# Patient Record
Sex: Male | Born: 1950
Health system: Southern US, Community
[De-identification: ages and names within clinical notes are randomized; demographics above are authoritative.]

## PROBLEM LIST (undated history)

## (undated) DIAGNOSIS — R112 Nausea with vomiting, unspecified: Secondary | ICD-10-CM

## (undated) DIAGNOSIS — K219 Gastro-esophageal reflux disease without esophagitis: Secondary | ICD-10-CM

## (undated) DIAGNOSIS — Z9889 Other specified postprocedural states: Secondary | ICD-10-CM

## (undated) DIAGNOSIS — K8020 Calculus of gallbladder without cholecystitis without obstruction: Secondary | ICD-10-CM

## (undated) DIAGNOSIS — C801 Malignant (primary) neoplasm, unspecified: Secondary | ICD-10-CM

## (undated) DIAGNOSIS — E790 Hyperuricemia without signs of inflammatory arthritis and tophaceous disease: Secondary | ICD-10-CM

## (undated) DIAGNOSIS — M1990 Unspecified osteoarthritis, unspecified site: Secondary | ICD-10-CM

## (undated) DIAGNOSIS — I499 Cardiac arrhythmia, unspecified: Secondary | ICD-10-CM

## (undated) DIAGNOSIS — N400 Enlarged prostate without lower urinary tract symptoms: Secondary | ICD-10-CM

## (undated) HISTORY — DX: Calculus of gallbladder without cholecystitis without obstruction: K80.20

## (undated) HISTORY — DX: Unspecified osteoarthritis, unspecified site: M19.90

## (undated) HISTORY — PX: EYE SURGERY: SHX253

---

## 2000-09-14 ENCOUNTER — Ambulatory Visit (HOSPITAL_COMMUNITY): Admission: RE | Admit: 2000-09-14 | Discharge: 2000-09-14 | Payer: Self-pay | Admitting: Family Medicine

## 2000-09-14 ENCOUNTER — Encounter: Payer: Self-pay | Admitting: Family Medicine

## 2002-02-24 ENCOUNTER — Ambulatory Visit (HOSPITAL_COMMUNITY): Admission: RE | Admit: 2002-02-24 | Discharge: 2002-02-24 | Payer: Self-pay | Admitting: Family Medicine

## 2002-02-24 ENCOUNTER — Encounter: Payer: Self-pay | Admitting: Family Medicine

## 2002-11-21 ENCOUNTER — Other Ambulatory Visit: Admission: RE | Admit: 2002-11-21 | Discharge: 2002-11-21 | Payer: Self-pay | Admitting: Urology

## 2003-10-29 ENCOUNTER — Emergency Department (HOSPITAL_COMMUNITY): Admission: EM | Admit: 2003-10-29 | Discharge: 2003-10-29 | Payer: Self-pay | Admitting: Emergency Medicine

## 2003-11-30 ENCOUNTER — Ambulatory Visit (HOSPITAL_COMMUNITY): Admission: RE | Admit: 2003-11-30 | Discharge: 2003-11-30 | Payer: Self-pay | Admitting: Family Medicine

## 2004-07-29 ENCOUNTER — Ambulatory Visit (HOSPITAL_COMMUNITY): Admission: RE | Admit: 2004-07-29 | Discharge: 2004-07-29 | Payer: Self-pay | Admitting: Family Medicine

## 2004-09-17 ENCOUNTER — Ambulatory Visit (HOSPITAL_COMMUNITY): Admission: RE | Admit: 2004-09-17 | Discharge: 2004-09-17 | Payer: Self-pay | Admitting: Orthopedic Surgery

## 2005-01-31 ENCOUNTER — Ambulatory Visit: Payer: Self-pay | Admitting: *Deleted

## 2005-02-07 ENCOUNTER — Ambulatory Visit (HOSPITAL_COMMUNITY): Admission: RE | Admit: 2005-02-07 | Discharge: 2005-02-07 | Payer: Self-pay | Admitting: *Deleted

## 2005-02-07 ENCOUNTER — Ambulatory Visit: Payer: Self-pay | Admitting: Cardiovascular Disease

## 2005-02-14 ENCOUNTER — Ambulatory Visit: Payer: Self-pay | Admitting: *Deleted

## 2005-12-12 ENCOUNTER — Ambulatory Visit: Payer: Self-pay | Admitting: *Deleted

## 2005-12-16 ENCOUNTER — Ambulatory Visit: Payer: Self-pay | Admitting: *Deleted

## 2005-12-16 ENCOUNTER — Encounter (HOSPITAL_COMMUNITY): Admission: RE | Admit: 2005-12-16 | Discharge: 2006-01-15 | Payer: Self-pay | Admitting: *Deleted

## 2006-01-09 ENCOUNTER — Ambulatory Visit: Payer: Self-pay | Admitting: Cardiology

## 2006-01-23 ENCOUNTER — Ambulatory Visit (HOSPITAL_COMMUNITY): Admission: RE | Admit: 2006-01-23 | Discharge: 2006-01-23 | Payer: Self-pay | Admitting: Cardiology

## 2006-03-17 ENCOUNTER — Ambulatory Visit (HOSPITAL_COMMUNITY): Admission: RE | Admit: 2006-03-17 | Discharge: 2006-03-17 | Payer: Self-pay | Admitting: Family Medicine

## 2006-04-03 ENCOUNTER — Ambulatory Visit (HOSPITAL_COMMUNITY): Admission: RE | Admit: 2006-04-03 | Discharge: 2006-04-03 | Payer: Self-pay | Admitting: Orthopedic Surgery

## 2006-05-21 ENCOUNTER — Emergency Department (HOSPITAL_COMMUNITY): Admission: EM | Admit: 2006-05-21 | Discharge: 2006-05-21 | Payer: Self-pay | Admitting: Emergency Medicine

## 2008-11-30 ENCOUNTER — Ambulatory Visit (HOSPITAL_COMMUNITY): Admission: RE | Admit: 2008-11-30 | Discharge: 2008-11-30 | Payer: Self-pay | Admitting: Family Medicine

## 2009-10-02 ENCOUNTER — Ambulatory Visit (HOSPITAL_COMMUNITY): Admission: RE | Admit: 2009-10-02 | Discharge: 2009-10-02 | Payer: Self-pay | Admitting: Family Medicine

## 2009-12-24 ENCOUNTER — Ambulatory Visit (HOSPITAL_COMMUNITY): Admission: RE | Admit: 2009-12-24 | Discharge: 2009-12-24 | Payer: Self-pay | Admitting: Family Medicine

## 2009-12-28 ENCOUNTER — Ambulatory Visit (HOSPITAL_COMMUNITY): Admission: RE | Admit: 2009-12-28 | Discharge: 2009-12-28 | Payer: Self-pay | Admitting: Family Medicine

## 2010-09-13 ENCOUNTER — Other Ambulatory Visit (HOSPITAL_COMMUNITY): Payer: Self-pay | Admitting: Family Medicine

## 2010-09-13 DIAGNOSIS — N281 Cyst of kidney, acquired: Secondary | ICD-10-CM

## 2010-09-13 NOTE — Procedures (Signed)
NAMEWOODSON, MACHA             ACCOUNT NO.:  [id number]   MEDICAL RECORD NO.:  [id number]          PATIENT TYPE:  OUT   LOCATION:  RAD                           FACILITY:  [facility]   PHYSICIAN:  Charlton Haws, M.D.     DATE OF BIRTH:  05/11/1950   DATE OF PROCEDURE:  DATE OF DISCHARGE:                                  ECHOCARDIOGRAM   INDICATIONS:  Lower extremity edema.  Check LV function.  Left ventricular  cavity size was normal.  Ejection fraction was 60%.  There was mild LVH.  There was no regional wall motion abnormality.  Mitral valve is mildly  thickened with trivial MR.  Left atrium and right sided carotid chambers are  normal.  There is no evidence of pulmonary hypertension.  There was mild TR.  Aortic valve was trileaflet and minimally sclerotic.  Pulmonary valve  appeared normal.  Subcostal imaging revealed no atrial septal defect.  No  source of embolus.  No effusion.   IMPRESSION:  1.  Mild left ventricular hypertrophy, ejection fraction 60%.  2.  Normal right sided cardiac chambers.  No evidence of pulmonary      hypertension.  3.  Mild tricuspid regurgitation.  4.  Trivial mitral regurgitation.  5.  Mild aortic valve sclerosis.  6.  No peripheral effusion.           ______________________________  Charlton Haws, M.D.     PN/MEDQ  D:  02/07/2005  T:  02/07/2005  Job:  [id number]   cc:   Angus G. Renard Matter, MD  Fax: [phone]

## 2010-09-13 NOTE — Assessment & Plan Note (Signed)
[facility]                         [place] [facility]IOLOGY OFFICE NOTE   [place], ANTOLIN                    MRN:          [id number]  DATE:12/12/2005                            DOB:          03/01/1951    PRIMARY CARE PHYSICIAN:  Angus G. Renard Matter, M.D.   Mr. Miller returns today.  I saw this gentleman back in October 2006, for  some lower extremity edema.  We did an echocardiogram which looked good,  lower extremity Doppler studies which looked good, and we never did really  get a sense for what his lower extremity edema was from.  It has pretty much  resolved now.  But, now he is having some discomfort in his chest which is  relatively atypical.  It is a sharp pain, lasting about 2-3 seconds, not  associated with exertion.  It usually happens when he is asleep.  No  shortness of breath.  No diaphoresis.  No PND or orthopnea.  And he says  that is kind of comes and goes.  He has not had any in the last couple of  days.   PAST MEDICAL HISTORY:  1. Hypertension.  2. Hyperlipidemia.  3. Obesity.   He is on the following medications:  Multivitamin, vitamin B1, B12, vitamin  C, zinc, Prilosec, and aspirin 325 once a day.   PHYSICAL EXAMINATION:  VITAL SIGNS:  Today, he is 244 pounds.  His blood  pressure is 118/76.  His pulse is 68.  CHEST:  Clear.  NECK:  He has no jugular venous distention or carotid bruits.  CARDIOVASCULAR:  Regular with no murmur.  LOWER EXTREMITIES:  Have trace edema.   He had an electrocardiogram done, in Dr. Marcelo Baldy' office, which is  unremarkable.  It did not look too different from the EKG that we got on him  in October, which was essentially normal.   So, we talked with Mr. and Mrs. Savo about what to do.  They have chosen  an exercise perfusion study which I think is a reasonable thing.  We will  get him set up for that.  We will see him back after the results of that are  completed.                  Farris Has. Dorethea Clan, MD   JMH/MedQ  DD:  12/12/2005  DT:  12/12/2005  Job #:  [id number]

## 2010-09-13 NOTE — Letter (Signed)
January 09, 2006     Angus G. Renard Matter, MD  [address]  [place], [place] [address]   RE:  YOSHIAKI, KREUSER  MRN:  [id number]  /  DOB:  [birth year]   Dear Thalia Party:   Mr. Peddie returns to the office following a recent stress nuclear study  that was entirely negative.  He continues to have some vague atypical chest  discomfort that he attributes to a musculoskeletal etiology. This does not  appear to be concerning him significantly.   He has had edema in the past, but this has resolved spontaneously.  He is  not currently using diuretics.   He has carried a diagnosis of hypertension in the past and been treated with  HCTZ.  He does not believe that he has any significant elevation in blood  pressure and is currently taking no medication other than a number of  vitamins.  Blood pressures have always been normal in the office.   PHYSICAL EXAMINATION:  VITAL SIGNS:  A pleasant gentleman in no acute  distress.  NECK:  No jugular venous distention.  No carotid bruits.  LUNGS:  Clear.  CARDIAC:  Fourth heart sounds present.  Normal first and second heart  sounds.  EXTREMITIES:  No edema.   IMPRESSION:  Mr. Beckom is doing extremely well.  He has no known  cardiovascular issues.  Please let me know at any time that I can assist in  his future care.  He is encouraged to exercise, lose weight, and to maintain  a healthful diet.    Sincerely,      Gerrit Friends. Dietrich Pates, MD, [facility]   RMR/MedQ  DD:  01/09/2006  DT:  01/10/2006  Job #:  [id number]

## 2010-09-17 ENCOUNTER — Ambulatory Visit (HOSPITAL_COMMUNITY)
Admission: RE | Admit: 2010-09-17 | Discharge: 2010-09-17 | Disposition: A | Discharge status: Home / Self Care | Payer: BC Managed Care – PPO | Source: Ambulatory Visit | Attending: Family Medicine | Admitting: Family Medicine

## 2010-09-17 DIAGNOSIS — Q619 Cystic kidney disease, unspecified: Secondary | ICD-10-CM | POA: Insufficient documentation

## 2010-09-17 DIAGNOSIS — N281 Cyst of kidney, acquired: Secondary | ICD-10-CM

## 2010-09-17 DIAGNOSIS — I714 Abdominal aortic aneurysm, without rupture, unspecified: Secondary | ICD-10-CM | POA: Insufficient documentation

## 2011-01-30 ENCOUNTER — Encounter: Payer: Self-pay | Admitting: *Deleted

## 2011-01-30 ENCOUNTER — Other Ambulatory Visit: Payer: Self-pay

## 2011-01-30 ENCOUNTER — Emergency Department (HOSPITAL_COMMUNITY)
Admission: EM | Admit: 2011-01-30 | Discharge: 2011-01-31 | Disposition: A | Discharge status: Home / Self Care | Payer: BC Managed Care – PPO | Attending: Emergency Medicine | Admitting: Emergency Medicine

## 2011-01-30 ENCOUNTER — Emergency Department (HOSPITAL_COMMUNITY): Payer: BC Managed Care – PPO

## 2011-01-30 DIAGNOSIS — R1110 Vomiting, unspecified: Secondary | ICD-10-CM | POA: Insufficient documentation

## 2011-01-30 DIAGNOSIS — Z79899 Other long term (current) drug therapy: Secondary | ICD-10-CM | POA: Insufficient documentation

## 2011-01-30 DIAGNOSIS — F17200 Nicotine dependence, unspecified, uncomplicated: Secondary | ICD-10-CM | POA: Insufficient documentation

## 2011-01-30 DIAGNOSIS — R002 Palpitations: Secondary | ICD-10-CM | POA: Insufficient documentation

## 2011-01-30 LAB — BASIC METABOLIC PANEL
CO2: 28 mEq/L (ref 19–32)
Calcium: 9.1 mg/dL (ref 8.4–10.5)
Chloride: 104 mEq/L (ref 96–112)
Potassium: 3.4 mEq/L — ABNORMAL LOW (ref 3.5–5.1)
Sodium: 140 mEq/L (ref 135–145)

## 2011-01-30 NOTE — ED Notes (Signed)
Pt reports intermittent episodes of "palpitations" starting this am, pt reports it feels like his heart is "stopping", pt denies any pain

## 2011-01-31 LAB — URINALYSIS, ROUTINE W REFLEX MICROSCOPIC
Bilirubin Urine: NEGATIVE
Hgb urine dipstick: NEGATIVE
Nitrite: NEGATIVE
Protein, ur: NEGATIVE mg/dL
Urobilinogen, UA: 0.2 mg/dL (ref 0.0–1.0)

## 2011-01-31 LAB — DIFFERENTIAL
Basophils Absolute: 0 10*3/uL (ref 0.0–0.1)
Basophils Relative: 0 % (ref 0–1)
Eosinophils Absolute: 0.2 10*3/uL (ref 0.0–0.7)
Eosinophils Relative: 2 % (ref 0–5)
Lymphocytes Relative: 32 % (ref 12–46)
Monocytes Relative: 7 % (ref 3–12)
Neutro Abs: 4.5 10*3/uL (ref 1.7–7.7)
Neutrophils Relative %: 58 % (ref 43–77)

## 2011-01-31 LAB — TSH: TSH: 3.982 u[IU]/mL (ref 0.350–4.500)

## 2011-01-31 LAB — CBC
Hemoglobin: 14.1 g/dL (ref 13.0–17.0)
MCH: 29.8 pg (ref 26.0–34.0)
WBC: 7.8 10*3/uL (ref 4.0–10.5)

## 2011-01-31 LAB — TROPONIN I: Troponin I: <0.3 ng/mL (ref <0.30)

## 2011-01-31 LAB — T4, FREE: Free T4: 1.14 ng/dL (ref 0.80–1.80)

## 2011-01-31 MED ORDER — POTASSIUM CHLORIDE CRYS ER 20 MEQ PO TBCR
40.0000 meq | EXTENDED_RELEASE_TABLET | Freq: Once | ORAL | Status: AC
  Administered 2011-01-31: 40 meq via ORAL
  Filled 2011-01-31: qty 2

## 2011-01-31 NOTE — ED Provider Notes (Signed)
History     CSN: [id number] Arrival date & time: 01/30/2011 10:34 PM  Chief Complaint  Patient presents with  . Palpitations    (Consider location/radiation/quality/duration/timing/severity/associated sxs/prior treatment) HPI Comments: Seen 55  Patient is a 60 y.o. male presenting with palpitations. The history is provided by the patient.  Palpitations  This is a new problem. The current episode started 12 to 24 hours ago (Patient began to notice occasionally irregular heart rate this morning. Has continued all day.). Episode frequency: intermittantly all day. The problem has not changed since onset.Associated with: nothing. Associated symptoms include vomiting. Pertinent negatives include no diaphoresis, no fever, no numbness, no chest pain, no chest pressure, no claudication, no exertional chest pressure, no near-syncope, no orthopnea, no abdominal pain, no nausea, no back pain, no leg pain, no lower extremity edema, no dizziness, no cough, no hemoptysis and no shortness of breath. Associated symptoms comments: Patient states he is aware of irregularity however does not have any symptoms associated with it.. He has tried nothing for the symptoms.    Past Medical History  Diagnosis Date  . Coronary artery disease     Past Surgical History  Procedure Date  . Eye surgery     No family history on file.  History  Substance Use Topics  . Smoking status: Current Some Day Smoker  . Smokeless tobacco: Current User    Types: Chew  . Alcohol Use: No      Review of Systems  Constitutional: Negative for fever and diaphoresis.  Respiratory: Negative for cough, hemoptysis and shortness of breath.   Cardiovascular: Positive for palpitations. Negative for chest pain, orthopnea, claudication and near-syncope.  Gastrointestinal: Positive for vomiting. Negative for nausea and abdominal pain.  Musculoskeletal: Negative for back pain.  Neurological: Negative for dizziness and numbness.    All other systems reviewed and are negative.    Allergies  Review of patient's allergies indicates no known allergies.  Home Medications   Current Outpatient Rx  Name Route Sig Dispense Refill  . CETIRIZINE HCL 10 MG PO TABS Oral Take 10 mg by mouth daily.      Carma Leaven M PLUS PO TABS Oral Take 1 tablet by mouth daily.      Marland Kitchen UNKNOWN TO PATIENT Oral Take 1 tablet by mouth daily. MEDICATION NAME UNKNOWN *For High Uric Acid*     . ACETAMINOPHEN 500 MG PO TABS Oral Take 500 mg by mouth as needed. For pain     . IBUPROFEN 200 MG PO TABS Oral Take 200 mg by mouth as needed. For pain       BP 148/89  Pulse 50  Temp(Src) 98.4 F (36.9 C) (Oral)  Resp 18  SpO2 95%  Physical Exam  Nursing note and vitals reviewed. Constitutional: He is oriented to person, place, and time. He appears well-developed and well-nourished.  HENT:  Head: Normocephalic and atraumatic.  Eyes: EOM are normal.  Neck: Normal range of motion. Neck supple. No JVD present.  Cardiovascular: Normal rate, normal heart sounds and intact distal pulses.        Occasional irregular rhythm  Pulmonary/Chest: Effort normal and breath sounds normal.  Musculoskeletal: Normal range of motion.  Neurological: He is alert and oriented to person, place, and time. He has normal reflexes.  Skin: Skin is warm and dry.    ED Course  Procedures (including critical care time)  Results for orders placed during the hospital encounter of 01/30/11  CBC      Component Value  Range   WBC 7.8  4.0 - 10.5 (K/uL)   RBC 4.73  4.22 - 5.81 (MIL/uL)   Hemoglobin 14.1  13.0 - 17.0 (g/dL)   HCT 16.1  09.6 - 04.5 (%)   MCV 89.2  78.0 - 100.0 (fL)   MCH 29.8  26.0 - 34.0 (pg)   MCHC 33.4  30.0 - 36.0 (g/dL)   RDW 40.9  81.1 - 91.4 (%)   Platelets 219  150 - 400 (K/uL)  DIFFERENTIAL      Component Value Range   Neutrophils Relative 58  43 - 77 (%)   Neutro Abs 4.5  1.7 - 7.7 (K/uL)   Lymphocytes Relative 32  12 - 46 (%)   Lymphs Abs 2.5   0.7 - 4.0 (K/uL)   Monocytes Relative 7  3 - 12 (%)   Monocytes Absolute 0.6  0.1 - 1.0 (K/uL)   Eosinophils Relative 2  0 - 5 (%)   Eosinophils Absolute 0.2  0.0 - 0.7 (K/uL)   Basophils Relative 0  0 - 1 (%)   Basophils Absolute 0.0  0.0 - 0.1 (K/uL)  BASIC METABOLIC PANEL      Component Value Range   Sodium 140  135 - 145 (mEq/L)   Potassium 3.4 (*) 3.5 - 5.1 (mEq/L)   Chloride 104  96 - 112 (mEq/L)   CO2 28  19 - 32 (mEq/L)   Glucose, Bld 108 (*) 70 - 99 (mg/dL)   BUN 11  6 - 23 (mg/dL)   Creatinine, Ser 7.82  0.50 - 1.35 (mg/dL)   Calcium 9.1  8.4 - 95.6 (mg/dL)   GFR calc non Af Amer 88 (*) >90 (mL/min)   GFR calc Af Amer >90  >90 (mL/min)  URINALYSIS, ROUTINE W REFLEX MICROSCOPIC      Component Value Range   Color, Urine STRAW (*) YELLOW    Appearance CLEAR  CLEAR    Specific Gravity, Urine 1.010  1.005 - 1.030    pH 6.5  5.0 - 8.0    Glucose, UA NEGATIVE  NEGATIVE (mg/dL)   Hgb urine dipstick NEGATIVE  NEGATIVE    Bilirubin Urine NEGATIVE  NEGATIVE    Ketones, ur NEGATIVE  NEGATIVE (mg/dL)   Protein, ur NEGATIVE  NEGATIVE (mg/dL)   Urobilinogen, UA 0.2  0.0 - 1.0 (mg/dL)   Nitrite NEGATIVE  NEGATIVE    Leukocytes, UA NEGATIVE  NEGATIVE   TROPONIN I      Component Value Range   Troponin I <0.30  <0.30 (ng/mL)  POCT I-STAT TROPONIN I      Component Value Range   Troponin i, poc 0.00  0.00 - 0.08 (ng/mL)   Comment 3           Dg Chest 2 View  01/31/2011  *RADIOLOGY REPORT*  Clinical Data: Palpitations  CHEST - 2 VIEW  Comparison: 10/02/2009; 11/30/2003; chest CT - 01/23/2006  Findings: Normal cardiac silhouette and mediastinal contours. There is persistent minimal heterogeneous opacities witin the left lower lung, likely atelectasis or scar.  No new focal parenchymal opacities.  Mild elevation of the right hemidiaphragm.  No pleural effusion or pneumothorax.  Unchanged bones.  IMPRESSION: No acute cardiopulmonary disease.  Original Report Authenticated By: Waynard Reeds, M.D.     Date: 01/31/2011  2227  Rate:62  Rhythm: normal sinus rhythm and premature atrial contractions (PAC)  QRS Axis: normal  Intervals: normal  ST/T Wave abnormalities: normal  Conduction Disutrbances:none  Narrative Interpretation:  Old EKG Reviewed: changes noted since EKG of 05/21/2006, PACs now present  Patient with palpitation all day with NO associated symptoms. Labs unremarkable. Potassium slightly low. Will replete. Patient ambulated in hallway with no additional symptoms and no significant increase in occasional PAC.Patient informed of clinical course, understand medical decision-making process, and agree with plan.Pt stable in ED with no significant deterioration in condition.Patient to follow up with PCP, Dr. Renard Matter. Referral to cardiology should he have further symptoms will be facilitated by PCP.   MDM Reviewed: nursing note and vitals Reviewed previous: labs, ECG and x-ray Interpretation: labs, ECG and x-ray         Aurther Loft S. Colon Branch, MD 01/31/11 [phone]

## 2011-01-31 NOTE — ED Notes (Signed)
Pt up to ambulate in hallway, placed back on cardiac monitor without showing any ectopy or pvcs.   Pt tolerated well, denies complaints

## 2012-01-09 ENCOUNTER — Ambulatory Visit (HOSPITAL_COMMUNITY)
Admission: RE | Admit: 2012-01-09 | Discharge: 2012-01-09 | Disposition: A | Discharge status: Home / Self Care | Payer: BC Managed Care – PPO | Source: Ambulatory Visit | Attending: Family Medicine | Admitting: Family Medicine

## 2012-01-09 ENCOUNTER — Other Ambulatory Visit (HOSPITAL_COMMUNITY): Payer: Self-pay | Admitting: Family Medicine

## 2012-01-09 DIAGNOSIS — Z0000 Encounter for general adult medical examination without abnormal findings: Secondary | ICD-10-CM | POA: Insufficient documentation

## 2012-01-09 DIAGNOSIS — Z87891 Personal history of nicotine dependence: Secondary | ICD-10-CM | POA: Insufficient documentation

## 2012-01-15 ENCOUNTER — Encounter (INDEPENDENT_AMBULATORY_CARE_PROVIDER_SITE_OTHER): Payer: Self-pay | Admitting: *Deleted

## 2012-02-09 ENCOUNTER — Other Ambulatory Visit (HOSPITAL_COMMUNITY): Payer: Self-pay | Admitting: Family Medicine

## 2012-02-09 DIAGNOSIS — R109 Unspecified abdominal pain: Secondary | ICD-10-CM

## 2012-02-10 ENCOUNTER — Ambulatory Visit (HOSPITAL_COMMUNITY)
Admission: RE | Admit: 2012-02-10 | Discharge: 2012-02-10 | Disposition: A | Discharge status: Home / Self Care | Payer: BC Managed Care – PPO | Source: Ambulatory Visit | Attending: Family Medicine | Admitting: Family Medicine

## 2012-02-10 DIAGNOSIS — K8020 Calculus of gallbladder without cholecystitis without obstruction: Secondary | ICD-10-CM | POA: Insufficient documentation

## 2012-02-10 DIAGNOSIS — R109 Unspecified abdominal pain: Secondary | ICD-10-CM | POA: Insufficient documentation

## 2012-11-19 ENCOUNTER — Encounter (INDEPENDENT_AMBULATORY_CARE_PROVIDER_SITE_OTHER): Payer: Self-pay | Admitting: *Deleted

## 2012-12-03 ENCOUNTER — Encounter (INDEPENDENT_AMBULATORY_CARE_PROVIDER_SITE_OTHER): Payer: Self-pay | Admitting: *Deleted

## 2012-12-21 ENCOUNTER — Encounter (HOSPITAL_COMMUNITY): Payer: Self-pay | Admitting: Pharmacy Technician

## 2012-12-21 ENCOUNTER — Ambulatory Visit (INDEPENDENT_AMBULATORY_CARE_PROVIDER_SITE_OTHER): Payer: BC Managed Care – PPO | Admitting: Internal Medicine

## 2012-12-21 ENCOUNTER — Telehealth (INDEPENDENT_AMBULATORY_CARE_PROVIDER_SITE_OTHER): Payer: Self-pay | Admitting: *Deleted

## 2012-12-21 ENCOUNTER — Other Ambulatory Visit (INDEPENDENT_AMBULATORY_CARE_PROVIDER_SITE_OTHER): Payer: Self-pay | Admitting: *Deleted

## 2012-12-21 ENCOUNTER — Encounter (INDEPENDENT_AMBULATORY_CARE_PROVIDER_SITE_OTHER): Payer: Self-pay | Admitting: Internal Medicine

## 2012-12-21 VITALS — BP 104/58 | HR 60 | Temp 98.9°F | Ht 72.0 in | Wt 248.4 lb

## 2012-12-21 DIAGNOSIS — Z1211 Encounter for screening for malignant neoplasm of colon: Secondary | ICD-10-CM

## 2012-12-21 DIAGNOSIS — R197 Diarrhea, unspecified: Secondary | ICD-10-CM

## 2012-12-21 DIAGNOSIS — M1990 Unspecified osteoarthritis, unspecified site: Secondary | ICD-10-CM | POA: Insufficient documentation

## 2012-12-21 DIAGNOSIS — M129 Arthropathy, unspecified: Secondary | ICD-10-CM

## 2012-12-21 DIAGNOSIS — K8020 Calculus of gallbladder without cholecystitis without obstruction: Secondary | ICD-10-CM

## 2012-12-21 NOTE — Patient Instructions (Addendum)
Fiber 4 gms po. Imodium twice a day. Colonoscopy with Dr. Karilyn Cota

## 2012-12-21 NOTE — Progress Notes (Signed)
Subjective:     Patient ID: Caleb Reed, male   DOB: 1950/05/21, 62 y.o.   MRN: [id number]  HPI Referred to our office by Dr. Renard Matter for diarrhea./screening colonoscopy.  He has had diarrhea off and on for 6-8 months. He tells me the Imodium has helped with his diarrhea. He does have normal, formed stools. He tells me he will have a loose stool about every other day.  No recent antibiotics. He did take an antibiotic for a dental procedure the first of the year. Symptoms started after taking the antibiotics. He usually has anywhere from 3-4 stools a day.  There has been no melena or bright red rectal bleeding. Sometimes he has some lower abdominal pain with his BMs. Appetite is good. He thinks he has had some weight loss from work. He has never undergone a colonoscopy in the past. After he eats a meal, within 30 monutes to an hour he will have to have a BM.  Stools studies in July by Dr. Renard Matter were negative.   11/05/2012 C. Diff negative. Stool culture was negative for salmonella, shigella, campylobacter, Yersinia, or E. Coli.   Review of Systems see hpi Current Outpatient Prescriptions  Medication Sig Dispense Refill  . acetaminophen (TYLENOL) 500 MG tablet Take 500 mg by mouth as needed. For pain       . allopurinol (ZYLOPRIM) 100 MG tablet Take 100 mg by mouth daily.      . fexofenadine (ALLEGRA) 180 MG tablet Take 180 mg by mouth daily.      Marland Kitchen ibuprofen (ADVIL,MOTRIN) 200 MG tablet Take 200 mg by mouth as needed. For pain       . loperamide (IMODIUM) 2 MG capsule Take 2 mg by mouth 4 (four) times daily as needed for diarrhea or loose stools.      . silodosin (RAPAFLO) 4 MG CAPS capsule Take 8 mg by mouth daily with breakfast.       No current facility-administered medications for this visit.   Past Surgical History  Procedure Laterality Date  . Eye surgery      Shit with an arrow. Can see some out of his eye   Allergies  Allergen Reactions  . Etodolac     Hives all  over   Past Medical History  Diagnosis Date  . Gallstones   . Arthritis         Objective:   Physical Exam  Filed Vitals:   12/21/12 1119  BP: 104/58  Pulse: 60  Temp: 98.9 F (37.2 C)  Height: 6' (1.829 m)  Weight: 248 lb 6.4 oz (112.674 kg)   Alert and oriented. Skin warm and dry. Oral mucosa is moist.   . Sclera anicteric, conjunctivae is pink. Thyroid not enlarged. No cervical lymphadenopathy. Lungs clear. Heart regular rate and rhythm.  Abdomen is soft. Bowel sounds are positive. No hepatomegaly. No abdominal masses felt. No tenderness.  No edema to lower extremities.  Small mass noted just below epigastric region.      Assessment:    Diarrhea which is better now. He will have every other day.  Her stools are more now.  He has never undergone a colonoscopy in the past..    Plan:    Fiber 4 gms. Imodium 2mg s BID. Colonoscopy with Dr. Karilyn Cota. The risks and benefits such as perforation, bleeding, and infection were reviewed with the patient and is agreeable.

## 2012-12-21 NOTE — Telephone Encounter (Signed)
Patient needs movi prep 

## 2012-12-23 MED ORDER — PEG-KCL-NACL-NASULF-NA ASC-C 100 G PO SOLR: 1.0000 | Freq: Once | ORAL | Status: DC

## 2012-12-29 ENCOUNTER — Encounter (HOSPITAL_COMMUNITY): Payer: Self-pay | Admitting: *Deleted

## 2012-12-29 ENCOUNTER — Encounter (HOSPITAL_COMMUNITY)
Admission: RE | Disposition: A | Discharge status: Home / Self Care | Payer: Self-pay | Source: Ambulatory Visit | Attending: Internal Medicine

## 2012-12-29 ENCOUNTER — Ambulatory Visit (HOSPITAL_COMMUNITY)
Admission: RE | Admit: 2012-12-29 | Discharge: 2012-12-29 | Disposition: A | Discharge status: Home / Self Care | Payer: BC Managed Care – PPO | Source: Ambulatory Visit | Attending: Internal Medicine | Admitting: Internal Medicine

## 2012-12-29 DIAGNOSIS — R109 Unspecified abdominal pain: Secondary | ICD-10-CM

## 2012-12-29 DIAGNOSIS — K5730 Diverticulosis of large intestine without perforation or abscess without bleeding: Secondary | ICD-10-CM | POA: Insufficient documentation

## 2012-12-29 DIAGNOSIS — D126 Benign neoplasm of colon, unspecified: Secondary | ICD-10-CM

## 2012-12-29 DIAGNOSIS — R197 Diarrhea, unspecified: Secondary | ICD-10-CM

## 2012-12-29 DIAGNOSIS — Z1211 Encounter for screening for malignant neoplasm of colon: Secondary | ICD-10-CM | POA: Insufficient documentation

## 2012-12-29 HISTORY — PX: COLONOSCOPY: SHX5424

## 2012-12-29 HISTORY — DX: Nausea with vomiting, unspecified: R11.2

## 2012-12-29 HISTORY — DX: Hyperuricemia without signs of inflammatory arthritis and tophaceous disease: E79.0

## 2012-12-29 HISTORY — DX: Other specified postprocedural states: Z98.890

## 2012-12-29 HISTORY — DX: Benign prostatic hyperplasia without lower urinary tract symptoms: N40.0

## 2012-12-29 SURGERY — COLONOSCOPY
Anesthesia: Moderate Sedation

## 2012-12-29 MED ORDER — STERILE WATER FOR IRRIGATION IR SOLN
Status: DC | PRN
  Administered 2012-12-29: 15:00:00

## 2012-12-29 MED ORDER — MIDAZOLAM HCL 5 MG/5ML IJ SOLN
INTRAMUSCULAR | Status: AC
  Filled 2012-12-29: qty 10

## 2012-12-29 MED ORDER — SODIUM CHLORIDE 0.9 % IV SOLN: INTRAVENOUS | Status: DC

## 2012-12-29 MED ORDER — MIDAZOLAM HCL 5 MG/5ML IJ SOLN
INTRAMUSCULAR | Status: DC | PRN
  Administered 2012-12-29 (×5): 2 mg via INTRAVENOUS

## 2012-12-29 MED ORDER — MEPERIDINE HCL 50 MG/ML IJ SOLN
INTRAMUSCULAR | Status: AC
  Filled 2012-12-29: qty 1

## 2012-12-29 MED ORDER — MEPERIDINE HCL 50 MG/ML IJ SOLN
INTRAMUSCULAR | Status: DC | PRN
  Administered 2012-12-29 (×2): 25 mg via INTRAVENOUS

## 2012-12-29 MED ORDER — LOPERAMIDE HCL 2 MG PO TABS: 2.0000 mg | ORAL_TABLET | Freq: Every day | ORAL | Status: DC

## 2012-12-29 NOTE — H&P (Signed)
Caleb Reed is an 62 y.o. male.   Chief Complaint: Patient is here for colonoscopy. HPI: Patient is a 62 year old Caucasian male who is here for colonoscopy primarily for screening purposes. However he does give history of diarrhea for the last 7-8 months. Diarrhea started after he was in antibiotic dental problems. And most days he has in 1-4 stools per day. She also complains of intermittent nagging pain across her upper abdomen which may occur a couple of times a week. He denies melena or rectal bleeding or weight loss. Family history is negative for colorectal carcinoma.  Past Medical History  Diagnosis Date  . Gallstones   . Arthritis   . Elevated uric acid in blood   . BPH (benign prostatic hyperplasia)   . PONV (postoperative nausea and vomiting)     Past Surgical History  Procedure Laterality Date  . Eye surgery      Shit with an arrow. Can see some out of his eye    Family History  Problem Relation Age of Onset  . Colon cancer Neg Hx    Social History:  reports that he has quit smoking. His smokeless tobacco use includes Chew. He reports that  drinks alcohol. He reports that he does not use illicit drugs.  Allergies:  Allergies  Allergen Reactions  . Etodolac     Hives all over    Medications Prior to Admission  Medication Sig Dispense Refill  . acetaminophen (TYLENOL) 500 MG tablet Take 500 mg by mouth as needed. For pain       . allopurinol (ZYLOPRIM) 100 MG tablet Take 100 mg by mouth daily.      . fexofenadine (ALLEGRA) 180 MG tablet Take 180 mg by mouth daily.      Marland Kitchen ibuprofen (ADVIL,MOTRIN) 200 MG tablet Take 200 mg by mouth as needed. For pain       . loperamide (IMODIUM) 2 MG capsule Take 2 mg by mouth 4 (four) times daily as needed for diarrhea or loose stools.      . peg 3350 powder (MOVIPREP) 100 G SOLR Take 1 kit (200 g total) by mouth once.  1 kit  0  . silodosin (RAPAFLO) 4 MG CAPS capsule Take 8 mg by mouth daily with breakfast.        No  results found for this or any previous visit (from the past 48 hour(s)). No results found.  ROS  Blood pressure 136/77, pulse 53, temperature 98.3 F (36.8 C), temperature source Oral, resp. rate 15, SpO2 94.00%. Physical Exam  Constitutional: He appears well-developed and well-nourished.  HENT:  Mouth/Throat: Oropharynx is clear and moist.  Eyes: Conjunctivae are normal. No scleral icterus.  Neck: No thyromegaly present.  Cardiovascular: Normal rate, regular rhythm and normal heart sounds.   No murmur heard. Respiratory: Effort normal and breath sounds normal.  GI: Soft. He exhibits no distension and no mass. There is no tenderness.  Musculoskeletal: He exhibits no edema.  Lymphadenopathy:    He has no cervical adenopathy.  Neurological: He is alert.  Skin: Skin is warm and dry.     Assessment/Plan Average risk screening colonoscopy. 7-8 month history of diarrhea.  REHMAN,NAJEEB U 12/29/2012, 2:53 PM

## 2012-12-29 NOTE — Op Note (Addendum)
COLONOSCOPY PROCEDURE REPORT  PATIENT:  Caleb Reed  MR#:  [id number] Birthdate:  1950/09/08, 62 y.o., male Endoscopist:  Dr. Malissa Hippo, MD Referred By:  Dr. Ishmael Holter. Renard Matter, MD  Procedure Date: 12/29/2012  Procedure:   Colonoscopy  Indications:  Patient is 62 year old Caucasian male who is here for screening colonoscopy. He does give seven months history of diarrhea. Stool studies were  negative. Diarrhea started when he took antibiotic for dental infection and has continued. Family history is negative for CRC.  Informed Consent:  The procedure and risks were reviewed with the patient and informed consent was obtained.  Medications:  Demerol 50 mg IV Versed 10 mg IV  Description of procedure:  After a digital rectal exam was performed, that colonoscope was advanced from the anus through the rectum and colon to the area of the cecum, ileocecal valve and appendiceal orifice. The cecum was deeply intubated. These structures were well-seen and photographed for the record. From the level of the cecum and ileocecal valve, the scope was slowly and cautiously withdrawn. The mucosal surfaces were carefully surveyed utilizing scope tip to flexion to facilitate fold flattening as needed. The scope was pulled down into the rectum where a thorough exam including retroflexion was performed.  Findings:   Prep satisfactory. Three small polyps ablated via cold biopsy from transverse colon and submitted together. These were located in close proximity. Few small diverticula at sigmoid colon. Random biopsies taken from normal-appearing mucosa of sigmoid colon. Normal rectal mucosa. Thickened anoderm.   Therapeutic/Diagnostic Maneuvers Performed:  See above  Complications:  None  Cecal Withdrawal Time:  10 minutes  Impression:  Examination performed to cecum. Three small polyps ablated while cold biopsy from transverse colon and submitted together. Mild sigmoid colon diverticulosis. No  evidence of endoscopic colitis. Random biopsies taken from mucosa of sigmoid colon looking for microscopic or collagenous colitis.  Recommendations:  Standard instructions given. Imodium OTC 2 mg by mouth every morning. High fiber diet. I will contact patient with biopsy results and further recommendations.  Analynn Daum U  12/29/2012 3:52 PM  CC: Dr. Alice Reichert, MD & Dr. Bonnetta Barry ref. provider found

## 2013-01-04 ENCOUNTER — Encounter (HOSPITAL_COMMUNITY): Payer: Self-pay | Admitting: Internal Medicine

## 2013-01-10 ENCOUNTER — Encounter (INDEPENDENT_AMBULATORY_CARE_PROVIDER_SITE_OTHER): Payer: Self-pay | Admitting: *Deleted

## 2013-02-21 ENCOUNTER — Telehealth (INDEPENDENT_AMBULATORY_CARE_PROVIDER_SITE_OTHER): Payer: Self-pay | Admitting: *Deleted

## 2013-02-21 NOTE — Telephone Encounter (Signed)
Caleb Reed and Caleb Reed called to scheduled an 8 week f/u from Caleb [facility]. Caleb Reed is still having abd trouble and feels like he needs to be seen. Offered him an apt for 02/23/13 and patient refused. He said he only wanted to see Dr. Karilyn Cota. Offered Caleb next available for 04/04/13 and was told he would be sick by then that he would just go to see Dr. Renard Matter.

## 2013-02-24 ENCOUNTER — Inpatient Hospital Stay (HOSPITAL_COMMUNITY): Payer: BC Managed Care – PPO

## 2013-02-24 ENCOUNTER — Emergency Department (HOSPITAL_COMMUNITY): Payer: BC Managed Care – PPO

## 2013-02-24 ENCOUNTER — Encounter (HOSPITAL_COMMUNITY): Payer: Self-pay | Admitting: Emergency Medicine

## 2013-02-24 ENCOUNTER — Inpatient Hospital Stay (HOSPITAL_COMMUNITY)
Admission: EM | Admit: 2013-02-24 | Discharge: 2013-02-26 | DRG: 419 | Disposition: A | Discharge status: Home / Self Care | Payer: BC Managed Care – PPO | Attending: General Surgery | Admitting: General Surgery

## 2013-02-24 DIAGNOSIS — K810 Acute cholecystitis: Secondary | ICD-10-CM

## 2013-02-24 DIAGNOSIS — N400 Enlarged prostate without lower urinary tract symptoms: Secondary | ICD-10-CM | POA: Diagnosis present

## 2013-02-24 DIAGNOSIS — Z87891 Personal history of nicotine dependence: Secondary | ICD-10-CM

## 2013-02-24 DIAGNOSIS — M129 Arthropathy, unspecified: Secondary | ICD-10-CM | POA: Diagnosis present

## 2013-02-24 DIAGNOSIS — K8000 Calculus of gallbladder with acute cholecystitis without obstruction: Principal | ICD-10-CM | POA: Diagnosis present

## 2013-02-24 HISTORY — DX: Malignant (primary) neoplasm, unspecified: C80.1

## 2013-02-24 HISTORY — DX: Gastro-esophageal reflux disease without esophagitis: K21.9

## 2013-02-24 LAB — COMPREHENSIVE METABOLIC PANEL WITH GFR
ALT: 15 U/L (ref 0–53)
AST: 14 U/L (ref 0–37)
Albumin: 3.7 g/dL (ref 3.5–5.2)
Alkaline Phosphatase: 103 U/L (ref 39–117)
BUN: 10 mg/dL (ref 6–23)
CO2: 28 meq/L (ref 19–32)
Calcium: 9.4 mg/dL (ref 8.4–10.5)
Chloride: 96 meq/L (ref 96–112)
Creatinine, Ser: 0.78 mg/dL (ref 0.50–1.35)
GFR calc Af Amer: >90 mL/min (ref >90)
GFR calc non Af Amer: >90 mL/min (ref >90)
Glucose, Bld: 151 mg/dL — ABNORMAL HIGH (ref 70–99)
Potassium: 4 meq/L (ref 3.5–5.1)
Sodium: 134 meq/L — ABNORMAL LOW (ref 135–145)
Total Bilirubin: 0.7 mg/dL (ref 0.3–1.2)
Total Protein: 7.2 g/dL (ref 6.0–8.3)

## 2013-02-24 LAB — CBC WITH DIFFERENTIAL/PLATELET
Hemoglobin: 14.2 g/dL (ref 13.0–17.0)
Lymphocytes Relative: 11 % — ABNORMAL LOW (ref 12–46)
Lymphs Abs: 1.1 10*3/uL (ref 0.7–4.0)
Monocytes Relative: 9 % (ref 3–12)
Neutro Abs: 8.2 10*3/uL — ABNORMAL HIGH (ref 1.7–7.7)
Neutrophils Relative %: 80 % — ABNORMAL HIGH (ref 43–77)
RBC: 4.77 MIL/uL (ref 4.22–5.81)
WBC: 10.3 10*3/uL (ref 4.0–10.5)

## 2013-02-24 LAB — LIPASE, BLOOD: Lipase: 15 U/L (ref 11–59)

## 2013-02-24 MED ORDER — HYDROMORPHONE HCL PF 1 MG/ML IJ SOLN
1.0000 mg | INTRAMUSCULAR | Status: DC | PRN
  Administered 2013-02-24 – 2013-02-25 (×7): 1 mg via INTRAVENOUS
  Filled 2013-02-24 (×7): qty 1

## 2013-02-24 MED ORDER — ACETAMINOPHEN 650 MG RE SUPP: 650.0000 mg | Freq: Four times a day (QID) | RECTAL | Status: DC | PRN

## 2013-02-24 MED ORDER — MUPIROCIN 2 % EX OINT
1.0000 "application " | TOPICAL_OINTMENT | Freq: Two times a day (BID) | CUTANEOUS | Status: DC
  Administered 2013-02-24 – 2013-02-25 (×3): 1 via NASAL
  Filled 2013-02-24: qty 22

## 2013-02-24 MED ORDER — DIPHENHYDRAMINE HCL 50 MG/ML IJ SOLN: 12.5000 mg | Freq: Four times a day (QID) | INTRAMUSCULAR | Status: DC | PRN

## 2013-02-24 MED ORDER — CHLORHEXIDINE GLUCONATE 4 % EX LIQD
1.0000 "application " | Freq: Once | CUTANEOUS | Status: AC
  Administered 2013-02-24: 1 via TOPICAL
  Filled 2013-02-24 (×2): qty 15

## 2013-02-24 MED ORDER — PANTOPRAZOLE SODIUM 40 MG IV SOLR
40.0000 mg | Freq: Every day | INTRAVENOUS | Status: DC
  Administered 2013-02-24: 40 mg via INTRAVENOUS
  Filled 2013-02-24: qty 40

## 2013-02-24 MED ORDER — SODIUM CHLORIDE 0.9 % IV SOLN
3.0000 g | Freq: Four times a day (QID) | INTRAVENOUS | Status: DC
  Administered 2013-02-24 – 2013-02-25 (×4): 3 g via INTRAVENOUS
  Filled 2013-02-24 (×17): qty 3

## 2013-02-24 MED ORDER — ONDANSETRON HCL 4 MG/2ML IJ SOLN
4.0000 mg | Freq: Four times a day (QID) | INTRAMUSCULAR | Status: DC | PRN
  Administered 2013-02-24 – 2013-02-25 (×4): 4 mg via INTRAVENOUS
  Filled 2013-02-24 (×3): qty 2

## 2013-02-24 MED ORDER — ACETAMINOPHEN 325 MG PO TABS: 650.0000 mg | ORAL_TABLET | Freq: Four times a day (QID) | ORAL | Status: DC | PRN

## 2013-02-24 MED ORDER — CHLORHEXIDINE GLUCONATE CLOTH 2 % EX PADS: 6.0000 | MEDICATED_PAD | Freq: Every day | CUTANEOUS | Status: DC

## 2013-02-24 MED ORDER — DIPHENHYDRAMINE HCL 12.5 MG/5ML PO ELIX: 12.5000 mg | ORAL_SOLUTION | Freq: Four times a day (QID) | ORAL | Status: DC | PRN

## 2013-02-24 MED ORDER — LACTATED RINGERS IV SOLN
INTRAVENOUS | Status: DC
  Administered 2013-02-24 – 2013-02-25 (×4): via INTRAVENOUS

## 2013-02-24 MED ORDER — ONDANSETRON HCL 4 MG/2ML IJ SOLN
4.0000 mg | Freq: Once | INTRAMUSCULAR | Status: AC
  Administered 2013-02-24: 4 mg via INTRAVENOUS
  Filled 2013-02-24: qty 2

## 2013-02-24 MED ORDER — MORPHINE SULFATE 4 MG/ML IJ SOLN
6.0000 mg | Freq: Once | INTRAMUSCULAR | Status: AC
  Administered 2013-02-24: 6 mg via INTRAVENOUS
  Filled 2013-02-24: qty 2

## 2013-02-24 MED ORDER — SODIUM CHLORIDE 0.9 % IV SOLN
3.0000 g | Freq: Four times a day (QID) | INTRAVENOUS | Status: DC
  Filled 2013-02-24 (×4): qty 3

## 2013-02-24 MED ORDER — ENOXAPARIN SODIUM 40 MG/0.4ML ~~LOC~~ SOLN
40.0000 mg | SUBCUTANEOUS | Status: DC
  Administered 2013-02-24: 40 mg via SUBCUTANEOUS
  Filled 2013-02-24 (×2): qty 0.4

## 2013-02-24 MED ORDER — FENTANYL CITRATE 0.05 MG/ML IJ SOLN
50.0000 ug | Freq: Once | INTRAMUSCULAR | Status: AC
  Administered 2013-02-24: 50 ug via INTRAVENOUS
  Filled 2013-02-24: qty 2

## 2013-02-24 NOTE — Progress Notes (Signed)
UR Chart Review Completed  

## 2013-02-24 NOTE — ED Provider Notes (Signed)
CSN: [id number]     Arrival date & time 02/24/13  0612 History   First MD Initiated Contact with Patient 02/24/13 [phone]     Chief Complaint  Patient presents with  . Abdominal Pain    HPI Patient has a known history of cholelithiasis.  He reports constant upper and right upper quadrant abdominal pain over the past 5-6 days.  She's had nausea without vomiting.  He denies fevers and chills.  no chest pain or shortness of breath.  His symptoms are moderate to severe in severity.  No urinary complaints.  No back pain.  He states he is scheduled to see one of the local surgeons in 4 days for evaluation of his cholelithiasis.   Past Medical History  Diagnosis Date  . Gallstones   . Arthritis   . Elevated uric acid in blood   . BPH (benign prostatic hyperplasia)   . PONV (postoperative nausea and vomiting)    Past Surgical History  Procedure Laterality Date  . Eye surgery      Shit with an arrow. Can see some out of his eye  . Colonoscopy N/A 12/29/2012    Procedure: COLONOSCOPY;  Surgeon: Malissa Hippo, MD;  Location: [facility];  Service: Endoscopy;  Laterality: N/A;  300-moved to 200 Ann to notify pt   Family History  Problem Relation Age of Onset  . Colon cancer Neg Hx    History  Substance Use Topics  . Smoking status: Former Games developer  . Smokeless tobacco: Current User    Types: Chew     Comment: quit years ago  . Alcohol Use: Yes     Comment: occasionally wine, not often    Review of Systems  All other systems reviewed and are negative.    Allergies  Etodolac and Hydrocodone  Home Medications   Current Outpatient Rx  Name  Route  Sig  Dispense  Refill  . acetaminophen (TYLENOL) 500 MG tablet   Oral   Take 500 mg by mouth as needed. For pain          . cetirizine (ZYRTEC) 10 MG tablet   Oral   Take 10 mg by mouth daily.         Marland Kitchen ibuprofen (ADVIL,MOTRIN) 200 MG tablet   Oral   Take 200 mg by mouth as needed. For pain          . Lactobacillus  Rhamnosus, GG, (CULTURELLE PO)   Oral   Take by mouth daily.         . lansoprazole (PREVACID) 15 MG capsule   Oral   Take 15 mg by mouth daily.         Marland Kitchen loperamide (IMODIUM A-D) 2 MG tablet   Oral   Take 1 tablet (2 mg total) by mouth daily before breakfast.   30 tablet   0   . silodosin (RAPAFLO) 4 MG CAPS capsule   Oral   Take 8 mg by mouth daily with breakfast.         . allopurinol (ZYLOPRIM) 100 MG tablet   Oral   Take 100 mg by mouth daily.         . fexofenadine (ALLEGRA) 180 MG tablet   Oral   Take 180 mg by mouth daily.          BP 149/73  Pulse 69  Temp(Src) 97.7 F (36.5 C) (Oral)  Resp 22  Ht 6' (1.829 m)  Wt 248 lb (112.492 kg)  BMI 33.63 kg/m2  SpO2 99% Physical Exam  Nursing note and vitals reviewed. Constitutional: He is oriented to person, place, and time. He appears well-developed and well-nourished.  HENT:  Head: Normocephalic and atraumatic.  Eyes: EOM are normal.  Neck: Normal range of motion.  Cardiovascular: Normal rate, regular rhythm, normal heart sounds and intact distal pulses.   Pulmonary/Chest: Effort normal and breath sounds normal. No respiratory distress.  Abdominal: Soft. He exhibits no distension.  RUQ and upper abdominal tenderness without guarding or rebound  Musculoskeletal: Normal range of motion.  Neurological: He is alert and oriented to person, place, and time.  Skin: Skin is warm and dry.  Psychiatric: He has a normal mood and affect. Judgment normal.    ED Course  Procedures (including critical care time) Labs Review Labs Reviewed  CBC WITH DIFFERENTIAL  COMPREHENSIVE METABOLIC PANEL  LIPASE, BLOOD   Imaging Review No results found.  EKG Interpretation   None       MDM  No diagnosis found. Labs and [place] pending at this time. Pain to be treated  Care to Dr Hyacinth Meeker    Lyanne Co, MD 02/24/13 [phone]

## 2013-02-24 NOTE — ED Notes (Signed)
Dr. Lovell Sheehan here for evaluation of pt.

## 2013-02-24 NOTE — H&P (Signed)
Caleb Reed is an 62 y.o. male.   Chief Complaint: Right upper quadrant abdominal pain HPI: Caleb Reed is a 62 year old white male who presents with a five-day history of worsening right upper quadrant abdominal pain. He was seen by his primary care physician earlier this week was scheduled to see another surgeon next week. He presents with worsening right upper quadrant abdominal pain and nausea. Ultrasound the gallbladder reveals cholelithiasis with a thickened gallbladder wall and a positive Murphy's sign. His common bile duct is upper limit of normal. No choledocholithiasis was seen.  Past Medical History  Diagnosis Date  . Gallstones   . Arthritis   . Elevated uric acid in blood   . BPH (benign prostatic hyperplasia)   . PONV (postoperative nausea and vomiting)     Past Surgical History  Procedure Laterality Date  . Eye surgery      Shit with an arrow. Can see some out of his eye  . Colonoscopy N/A 12/29/2012    Procedure: COLONOSCOPY;  Surgeon: Malissa Hippo, MD;  Location: [facility];  Service: Endoscopy;  Laterality: N/A;  300-moved to 200 Ann to notify pt    Family History  Problem Relation Age of Onset  . Colon cancer Neg Hx    Social History:  reports that he has quit smoking. His smokeless tobacco use includes Chew. He reports that he drinks alcohol. He reports that he does not use illicit drugs.  Allergies:  Allergies  Allergen Reactions  . Etodolac     Hives all over  . Hydrocodone Itching     (Not in a hospital admission)  Results for orders placed during the hospital encounter of 02/24/13 (from the past 48 hour(s))  CBC WITH DIFFERENTIAL     Status: Abnormal   Collection Time    02/24/13  6:33 AM      Result Value Range   WBC 10.3  4.0 - 10.5 K/uL   RBC 4.77  4.22 - 5.81 MIL/uL   Hemoglobin 14.2  13.0 - 17.0 g/dL   HCT 16.1  09.6 - 04.5 %   MCV 88.1  78.0 - 100.0 fL   MCH 29.8  26.0 - 34.0 pg   MCHC 33.8  30.0 - 36.0 g/dL   RDW 40.9  81.1 - 91.4  %   Platelets 240  150 - 400 K/uL   Neutrophils Relative % 80 (*) 43 - 77 %   Neutro Abs 8.2 (*) 1.7 - 7.7 K/uL   Lymphocytes Relative 11 (*) 12 - 46 %   Lymphs Abs 1.1  0.7 - 4.0 K/uL   Monocytes Relative 9  3 - 12 %   Monocytes Absolute 0.9  0.1 - 1.0 K/uL   Eosinophils Relative 0  0 - 5 %   Eosinophils Absolute 0.0  0.0 - 0.7 K/uL   Basophils Relative 0  0 - 1 %   Basophils Absolute 0.0  0.0 - 0.1 K/uL  COMPREHENSIVE METABOLIC PANEL     Status: Abnormal   Collection Time    02/24/13  6:33 AM      Result Value Range   Sodium 134 (*) 135 - 145 mEq/L   Potassium 4.0  3.5 - 5.1 mEq/L   Chloride 96  96 - 112 mEq/L   CO2 28  19 - 32 mEq/L   Glucose, Bld 151 (*) 70 - 99 mg/dL   BUN 10  6 - 23 mg/dL   Creatinine, Ser 7.82  0.50 - 1.35 mg/dL  Calcium 9.4  8.4 - 10.5 mg/dL   Total Protein 7.2  6.0 - 8.3 g/dL   Albumin 3.7  3.5 - 5.2 g/dL   AST 14  0 - 37 U/L   ALT 15  0 - 53 U/L   Alkaline Phosphatase 103  39 - 117 U/L   Total Bilirubin 0.7  0.3 - 1.2 mg/dL   GFR calc non Af Amer >90  >90 mL/min   GFR calc Af Amer >90  >90 mL/min   Comment: (NOTE)     The eGFR has been calculated using the CKD EPI equation.     This calculation has not been validated in all clinical situations.     eGFR's persistently <90 mL/min signify possible Chronic Kidney     Disease.  LIPASE, BLOOD     Status: None   Collection Time    02/24/13  6:33 AM      Result Value Range   Lipase 15  11 - 59 U/L   US Abdomen Complete  02/24/2013   CLINICAL DATA:  Right upper quadrant abdominal pain.  EXAM: ULTRASOUND ABDOMEN COMPLETE  COMPARISON:  Ultrasound of February 10, 2012.  FINDINGS: Gallbladder  Stones and sludge are noted within the gallbladder lumen, and gallbladder wall is moderately thickened at 5 mm. No pericholecystic fluid is noted, however positive sonographic Murphy's sign is noted.  Common bile duct  Diameter: Measures 7.4 mm the which is slightly dilated and correlation with liver function tests is  recommended to rule out obstruction.  Liver  No focal lesion identified. Increased echogenicity is noted suggesting fatty infiltration.  IVC  No abnormality visualized.  Pancreas  Visualized portions appear normal.  Spleen  Size and appearance within normal limits.  Right Kidney  Length: Measures 11.2 cm. Echogenicity within normal limits. No mass or hydronephrosis visualized.  Left Kidney  Length: Measures 11.7 cm. Echogenicity within normal limits. No mass or hydronephrosis visualized.  Abdominal aorta  No aneurysm visualized.  IMPRESSION: Fatty infiltration of the liver. Sludge and stones identified within gallbladder lumen with gallbladder wall thickening and positive sonographic Murphy sign; these findings are concerning for acute cholecystitis. Mild dilatation of common bile duct is noted and correlation with liver function tests is recommended to rule out obstruction.   Electronically Signed   By: Roque Lias M.D.   On: 02/24/2013 08:02    Review of Systems  Constitutional: Positive for malaise/fatigue.  HENT: Negative.   Eyes: Negative.   Respiratory: Positive for cough.   Cardiovascular: Negative.   Gastrointestinal: Positive for nausea and abdominal pain.  Genitourinary: Negative.   Musculoskeletal: Negative.   Skin: Negative.   Neurological: Negative.   Endo/Heme/Allergies: Negative.     Blood pressure 149/73, pulse 69, temperature 97.7 F (36.5 C), temperature source Oral, resp. rate 22, height 6' (1.829 m), weight 112.492 kg (248 lb), SpO2 99.00%. Physical Exam  Constitutional: He is oriented to person, place, and time. He appears well-developed and well-nourished.  HENT:  Head: Normocephalic and atraumatic.  Eyes: No scleral icterus.  Neck: Normal range of motion. Neck supple.  Cardiovascular: Normal rate, regular rhythm and normal heart sounds.   Respiratory: Effort normal and breath sounds normal.  GI: Soft. Bowel sounds are normal. There is tenderness.  Tender in the  right upper quadrant to palpation. No rigidity noted.  Neurological: He is alert and oriented to person, place, and time.  Skin: Skin is warm and dry.     Assessment/Plan Impression: Acute cholecystitis, cholelithiasis Plan:  Will admit the Caleb Reed to the hospital for IV hydration and IV Unasyn. He subsequently will undergo a laparoscopic cholecystectomy. The risks and benefits of the procedure including bleeding, infection, hepatobiliary injury, and the possibility of an open procedure were fully explained to the Caleb Reed, who gave informed consent.  Christopherjohn Schiele A 02/24/2013, 8:41 AM

## 2013-02-24 NOTE — ED Notes (Signed)
Spoke with Aundra Millet on phone for report. Will call back when available.

## 2013-02-24 NOTE — Progress Notes (Signed)
CRITICAL VALUE ALERT  Critical value received:  SA positive  Date of notification:  10/30  Time of notification:  1700  Critical value read back:yes  Nurse who received alert:  Sherrye Payor RN  MD notified (1st page):  Lovell Sheehan  Time of first page:  1715  MD notified (2nd page):  Time of second page:  Responding MD:  Lovell Sheehan  Time MD responded:  [phone]

## 2013-02-24 NOTE — ED Notes (Signed)
Pt reports upper abdominal pain that started last week, pt has appointment Monday w/ Dr Malvin Johns for gall bladder evaluation. Pt states pain medication he has is not working. Pt states nausea but no vomiting.

## 2013-02-24 NOTE — ED Notes (Signed)
Pt requesting pain medication prior to transfer to floor.

## 2013-02-24 NOTE — ED Provider Notes (Signed)
Care d/w Dr. Lovell Sheehan who will come to see pt for admission  Vida Roller, MD 02/24/13 [phone]

## 2013-02-25 ENCOUNTER — Encounter (HOSPITAL_COMMUNITY): Payer: BC Managed Care – PPO | Admitting: Anesthesiology

## 2013-02-25 ENCOUNTER — Inpatient Hospital Stay (HOSPITAL_COMMUNITY): Payer: BC Managed Care – PPO

## 2013-02-25 ENCOUNTER — Encounter (HOSPITAL_COMMUNITY): Payer: Self-pay | Admitting: *Deleted

## 2013-02-25 ENCOUNTER — Encounter (HOSPITAL_COMMUNITY)
Admission: EM | Disposition: A | Discharge status: Home / Self Care | Payer: Self-pay | Source: Home / Self Care | Attending: General Surgery

## 2013-02-25 ENCOUNTER — Inpatient Hospital Stay (HOSPITAL_COMMUNITY): Payer: BC Managed Care – PPO | Admitting: Anesthesiology

## 2013-02-25 HISTORY — PX: CHOLECYSTECTOMY: SHX55

## 2013-02-25 LAB — COMPREHENSIVE METABOLIC PANEL
ALT: 41 U/L (ref 0–53)
Alkaline Phosphatase: 112 U/L (ref 39–117)
BUN: 9 mg/dL (ref 6–23)
CO2: 30 mEq/L (ref 19–32)
Chloride: 96 mEq/L (ref 96–112)
Creatinine, Ser: 0.81 mg/dL (ref 0.50–1.35)
GFR calc Af Amer: >90 mL/min (ref >90)
GFR calc non Af Amer: >90 mL/min (ref >90)
Glucose, Bld: 119 mg/dL — ABNORMAL HIGH (ref 70–99)
Potassium: 3.8 mEq/L (ref 3.5–5.1)
Sodium: 134 mEq/L — ABNORMAL LOW (ref 135–145)
Total Bilirubin: 1.1 mg/dL (ref 0.3–1.2)
Total Protein: 6.3 g/dL (ref 6.0–8.3)

## 2013-02-25 LAB — CBC
Hemoglobin: 13.2 g/dL (ref 13.0–17.0)
MCHC: 33.2 g/dL (ref 30.0–36.0)
RDW: 12.8 % (ref 11.5–15.5)
WBC: 11.1 10*3/uL — ABNORMAL HIGH (ref 4.0–10.5)

## 2013-02-25 SURGERY — LAPAROSCOPIC CHOLECYSTECTOMY
Anesthesia: General | Site: Abdomen | Wound class: Contaminated

## 2013-02-25 MED ORDER — BUPIVACAINE HCL (PF) 0.5 % IJ SOLN
INTRAMUSCULAR | Status: DC | PRN
  Administered 2013-02-25: 10 mL

## 2013-02-25 MED ORDER — ONDANSETRON HCL 4 MG/2ML IJ SOLN: 4.0000 mg | Freq: Once | INTRAMUSCULAR | Status: DC | PRN

## 2013-02-25 MED ORDER — GLYCOPYRROLATE 0.2 MG/ML IJ SOLN
0.2000 mg | Freq: Once | INTRAMUSCULAR | Status: AC
  Administered 2013-02-25: 0.2 mg via INTRAVENOUS

## 2013-02-25 MED ORDER — FENTANYL CITRATE 0.05 MG/ML IJ SOLN
25.0000 ug | INTRAMUSCULAR | Status: AC
  Administered 2013-02-25 (×2): 25 ug via INTRAVENOUS

## 2013-02-25 MED ORDER — ROCURONIUM BROMIDE 50 MG/5ML IV SOLN
INTRAVENOUS | Status: AC
  Filled 2013-02-25: qty 1

## 2013-02-25 MED ORDER — ONDANSETRON HCL 4 MG/2ML IJ SOLN: 4.0000 mg | Freq: Four times a day (QID) | INTRAMUSCULAR | Status: DC | PRN

## 2013-02-25 MED ORDER — BUPIVACAINE HCL (PF) 0.5 % IJ SOLN
INTRAMUSCULAR | Status: AC
  Filled 2013-02-25: qty 30

## 2013-02-25 MED ORDER — FENTANYL CITRATE 0.05 MG/ML IJ SOLN
INTRAMUSCULAR | Status: AC
  Filled 2013-02-25: qty 2

## 2013-02-25 MED ORDER — ACETAMINOPHEN 325 MG PO TABS: 650.0000 mg | ORAL_TABLET | Freq: Four times a day (QID) | ORAL | Status: DC | PRN

## 2013-02-25 MED ORDER — FENTANYL CITRATE 0.05 MG/ML IJ SOLN
25.0000 ug | INTRAMUSCULAR | Status: DC | PRN
  Administered 2013-02-25: 50 ug via INTRAVENOUS

## 2013-02-25 MED ORDER — ENOXAPARIN SODIUM 40 MG/0.4ML ~~LOC~~ SOLN
40.0000 mg | SUBCUTANEOUS | Status: DC
  Administered 2013-02-25: 40 mg via SUBCUTANEOUS

## 2013-02-25 MED ORDER — LIDOCAINE HCL (CARDIAC) 10 MG/ML IV SOLN
INTRAVENOUS | Status: DC | PRN
  Administered 2013-02-25: 20 mg via INTRAVENOUS

## 2013-02-25 MED ORDER — 0.9 % SODIUM CHLORIDE (POUR BTL) OPTIME
TOPICAL | Status: DC | PRN
  Administered 2013-02-25: 1000 mL

## 2013-02-25 MED ORDER — GLYCOPYRROLATE 0.2 MG/ML IJ SOLN
INTRAMUSCULAR | Status: AC
  Filled 2013-02-25: qty 1

## 2013-02-25 MED ORDER — SUCCINYLCHOLINE CHLORIDE 20 MG/ML IJ SOLN
INTRAMUSCULAR | Status: DC | PRN
  Administered 2013-02-25: 140 mg via INTRAVENOUS

## 2013-02-25 MED ORDER — LIDOCAINE HCL (PF) 1 % IJ SOLN
INTRAMUSCULAR | Status: AC
  Filled 2013-02-25: qty 5

## 2013-02-25 MED ORDER — DEXAMETHASONE SODIUM PHOSPHATE 4 MG/ML IJ SOLN
4.0000 mg | Freq: Once | INTRAMUSCULAR | Status: AC
  Administered 2013-02-25: 4 mg via INTRAVENOUS

## 2013-02-25 MED ORDER — GLYCOPYRROLATE 0.2 MG/ML IJ SOLN
INTRAMUSCULAR | Status: DC | PRN
  Administered 2013-02-25: 0.4 mg via INTRAVENOUS

## 2013-02-25 MED ORDER — ONDANSETRON HCL 4 MG PO TABS: 4.0000 mg | ORAL_TABLET | Freq: Four times a day (QID) | ORAL | Status: DC | PRN

## 2013-02-25 MED ORDER — GLYCOPYRROLATE 0.2 MG/ML IJ SOLN
INTRAMUSCULAR | Status: AC
  Filled 2013-02-25: qty 2

## 2013-02-25 MED ORDER — OXYCODONE-ACETAMINOPHEN 5-325 MG PO TABS: 1.0000 | ORAL_TABLET | ORAL | Status: DC | PRN

## 2013-02-25 MED ORDER — HEMOSTATIC AGENTS (NO CHARGE) OPTIME
TOPICAL | Status: DC | PRN
  Administered 2013-02-25 (×2): 1 via TOPICAL

## 2013-02-25 MED ORDER — ONDANSETRON HCL 4 MG/2ML IJ SOLN
INTRAMUSCULAR | Status: AC
  Filled 2013-02-25: qty 2

## 2013-02-25 MED ORDER — LACTATED RINGERS IV SOLN
INTRAVENOUS | Status: DC
Start: 2013-02-25 — End: 2013-02-25

## 2013-02-25 MED ORDER — NEOSTIGMINE METHYLSULFATE 1 MG/ML IJ SOLN
INTRAMUSCULAR | Status: DC | PRN
  Administered 2013-02-25: 2 mg via INTRAVENOUS

## 2013-02-25 MED ORDER — MIDAZOLAM HCL 2 MG/2ML IJ SOLN
INTRAMUSCULAR | Status: AC
  Filled 2013-02-25: qty 2

## 2013-02-25 MED ORDER — HYDROMORPHONE HCL PF 1 MG/ML IJ SOLN
1.0000 mg | INTRAMUSCULAR | Status: DC | PRN
  Administered 2013-02-25 – 2013-02-26 (×3): 1 mg via INTRAVENOUS
  Filled 2013-02-25 (×3): qty 1

## 2013-02-25 MED ORDER — FENTANYL CITRATE 0.05 MG/ML IJ SOLN
INTRAMUSCULAR | Status: DC | PRN
  Administered 2013-02-25 (×4): 50 ug via INTRAVENOUS

## 2013-02-25 MED ORDER — SODIUM CHLORIDE 0.9 % IR SOLN
Status: DC | PRN
  Administered 2013-02-25: 3000 mL

## 2013-02-25 MED ORDER — ROCURONIUM BROMIDE 100 MG/10ML IV SOLN
INTRAVENOUS | Status: DC | PRN
  Administered 2013-02-25 (×2): 10 mg via INTRAVENOUS
  Administered 2013-02-25: 25 mg via INTRAVENOUS
  Administered 2013-02-25: 5 mg via INTRAVENOUS
  Administered 2013-02-25: 10 mg via INTRAVENOUS
  Administered 2013-02-25: 5 mg via INTRAVENOUS

## 2013-02-25 MED ORDER — ONDANSETRON HCL 4 MG/2ML IJ SOLN: 4.0000 mg | Freq: Once | INTRAMUSCULAR | Status: DC

## 2013-02-25 MED ORDER — DEXAMETHASONE SODIUM PHOSPHATE 4 MG/ML IJ SOLN
INTRAMUSCULAR | Status: AC
  Filled 2013-02-25: qty 1

## 2013-02-25 MED ORDER — MIDAZOLAM HCL 2 MG/2ML IJ SOLN
1.0000 mg | INTRAMUSCULAR | Status: DC | PRN
  Administered 2013-02-25: 2 mg via INTRAVENOUS

## 2013-02-25 MED ORDER — SODIUM CHLORIDE 0.9 % IV SOLN
3.0000 g | Freq: Four times a day (QID) | INTRAVENOUS | Status: DC
  Administered 2013-02-25 – 2013-02-26 (×4): 3 g via INTRAVENOUS
  Filled 2013-02-25 (×13): qty 3

## 2013-02-25 MED ORDER — SUCCINYLCHOLINE CHLORIDE 20 MG/ML IJ SOLN
INTRAMUSCULAR | Status: AC
  Filled 2013-02-25: qty 1

## 2013-02-25 MED ORDER — PROPOFOL 10 MG/ML IV BOLUS
INTRAVENOUS | Status: DC | PRN
  Administered 2013-02-25: 20 mg via INTRAVENOUS
  Administered 2013-02-25: 30 mg via INTRAVENOUS
  Administered 2013-02-25: 150 mg via INTRAVENOUS

## 2013-02-25 MED ORDER — LACTATED RINGERS IV SOLN
INTRAVENOUS | Status: DC
  Administered 2013-02-25 (×2): via INTRAVENOUS

## 2013-02-25 MED ORDER — PROPOFOL 10 MG/ML IV EMUL
INTRAVENOUS | Status: AC
  Filled 2013-02-25: qty 20

## 2013-02-25 SURGICAL SUPPLY — 46 items
APPLIER CLIP LAPSCP 10X32 DD (CLIP) ×2 IMPLANT
BAG HAMPER (MISCELLANEOUS) ×2 IMPLANT
CLOTH BEACON ORANGE TIMEOUT ST (SAFETY) ×2 IMPLANT
COVER LIGHT HANDLE STERIS (MISCELLANEOUS) ×4 IMPLANT
DECANTER SPIKE VIAL GLASS SM (MISCELLANEOUS) ×2 IMPLANT
DURAPREP 26ML APPLICATOR (WOUND CARE) ×2 IMPLANT
ELECT REM PT RETURN 9FT ADLT (ELECTROSURGICAL) ×2
ELECTRODE REM PT RTRN 9FT ADLT (ELECTROSURGICAL) ×1 IMPLANT
FILTER SMOKE EVAC LAPAROSHD (FILTER) ×2 IMPLANT
FORMALIN 10 PREFIL 120ML (MISCELLANEOUS) IMPLANT
FORMALIN 10 PREFIL 480ML (MISCELLANEOUS) ×2 IMPLANT
GLOVE BIO SURGEON STRL SZ7.5 (GLOVE) ×2 IMPLANT
GLOVE BIOGEL PI IND STRL 7.0 (GLOVE) ×1 IMPLANT
GLOVE BIOGEL PI IND STRL 7.5 (GLOVE) ×1 IMPLANT
GLOVE BIOGEL PI IND STRL 8 (GLOVE) ×1 IMPLANT
GLOVE BIOGEL PI INDICATOR 7.0 (GLOVE) ×1
GLOVE BIOGEL PI INDICATOR 7.5 (GLOVE) ×1
GLOVE BIOGEL PI INDICATOR 8 (GLOVE) ×1
GLOVE ECLIPSE 7.0 STRL STRAW (GLOVE) ×2 IMPLANT
GLOVE ECLIPSE 7.5 STRL STRAW (GLOVE) ×2 IMPLANT
GLOVE EXAM NITRILE MD LF STRL (GLOVE) ×2 IMPLANT
GOWN STRL REIN XL XLG (GOWN DISPOSABLE) ×6 IMPLANT
HEMOSTAT SNOW SURGICEL 2X4 (HEMOSTASIS) ×4 IMPLANT
INST SET LAPROSCOPIC AP (KITS) ×2 IMPLANT
IV NS IRRIG 3000ML ARTHROMATIC (IV SOLUTION) ×2 IMPLANT
KIT ROOM TURNOVER APOR (KITS) ×2 IMPLANT
MANIFOLD NEPTUNE II (INSTRUMENTS) ×2 IMPLANT
NEEDLE INSUFFLATION 14GA 120MM (NEEDLE) ×2 IMPLANT
NS IRRIG 1000ML POUR BTL (IV SOLUTION) ×2 IMPLANT
PACK LAP CHOLE LZT030E (CUSTOM PROCEDURE TRAY) ×2 IMPLANT
PAD ARMBOARD 7.5X6 YLW CONV (MISCELLANEOUS) ×2 IMPLANT
PENCIL HANDSWITCHING (ELECTRODE) ×2 IMPLANT
POUCH SPECIMEN RETRIEVAL 10MM (ENDOMECHANICALS) ×2 IMPLANT
SET BASIN LINEN APH (SET/KITS/TRAYS/PACK) ×2 IMPLANT
SET TUBE IRRIG SUCTION NO TIP (IRRIGATION / IRRIGATOR) ×2 IMPLANT
SLEEVE ENDOPATH XCEL 5M (ENDOMECHANICALS) ×4 IMPLANT
SPONGE GAUZE 2X2 8PLY STRL LF (GAUZE/BANDAGES/DRESSINGS) ×10 IMPLANT
STAPLER VISISTAT (STAPLE) ×2 IMPLANT
SUT VICRYL 0 UR6 27IN ABS (SUTURE) ×6 IMPLANT
TAPE CLOTH SURG 4X10 WHT LF (GAUZE/BANDAGES/DRESSINGS) ×2 IMPLANT
TROCAR ENDO BLADELESS 11MM (ENDOMECHANICALS) ×2 IMPLANT
TROCAR XCEL NON-BLD 5MMX100MML (ENDOMECHANICALS) ×2 IMPLANT
TROCAR XCEL UNIV SLVE 11M 100M (ENDOMECHANICALS) ×2 IMPLANT
TUBING INSUFFLATION (TUBING) ×2 IMPLANT
WARMER LAPAROSCOPE (MISCELLANEOUS) ×2 IMPLANT
YANKAUER SUCT 12FT TUBE ARGYLE (SUCTIONS) ×2 IMPLANT

## 2013-02-25 NOTE — Transfer of Care (Signed)
Immediate Anesthesia Transfer of Care Note  Patient: Caleb Reed  Procedure(s) Performed: Procedure(s) (LRB): LAPAROSCOPIC CHOLECYSTECTOMY (N/A)  Patient Location: PACU  Anesthesia Type: General  Level of Consciousness: awake  Airway & Oxygen Therapy: Patient Spontanous Breathing and non-rebreather face mask  Post-op Assessment: Report given to PACU RN, Post -op Vital signs reviewed and stable and Patient moving all extremities  Post vital signs: Reviewed and stable  Complications: No apparent anesthesia complications

## 2013-02-25 NOTE — Anesthesia Preprocedure Evaluation (Signed)
Anesthesia Evaluation  Patient identified by MRN, date of birth, ID band Patient awake    Reviewed: Allergy & Precautions, H&P , NPO status , Patient's Chart, lab work & pertinent test results  History of Anesthesia Complications (+) PONV and history of anesthetic complications  Airway Mallampati: II TM Distance: >3 FB Neck ROM: Full    Dental  (+) Partial Upper and Teeth Intact   Pulmonary former smoker,  breath sounds clear to auscultation        Cardiovascular negative cardio ROS  Rhythm:Regular Rate:Normal     Neuro/Psych    GI/Hepatic   Endo/Other  Morbid obesity  Renal/GU      Musculoskeletal   Abdominal   Peds  Hematology   Anesthesia Other Findings Acute RUQ pain, nausea.  Reproductive/Obstetrics                           Anesthesia Physical Anesthesia Plan  ASA: II  Anesthesia Plan: General   Post-op Pain Management:    Induction: Intravenous, Rapid sequence and Cricoid pressure planned  Airway Management Planned: Oral ETT  Additional Equipment:   Intra-op Plan:   Post-operative Plan: Extubation in OR  Informed Consent: I have reviewed the patients History and Physical, chart, labs and discussed the procedure including the risks, benefits and alternatives for the proposed anesthesia with the patient or authorized representative who has indicated his/her understanding and acceptance.     Plan Discussed with:   Anesthesia Plan Comments:         Anesthesia Quick Evaluation

## 2013-02-25 NOTE — Progress Notes (Signed)
Pt up and ambulated to bathroom No dizziness no nausea Mild pain at 4 on scale

## 2013-02-25 NOTE — Anesthesia Procedure Notes (Signed)
Procedure Name: Intubation Date/Time: 02/25/2013 9:08 AM Performed by: Franco Nones Pre-anesthesia Checklist: Patient identified, Patient being monitored, Timeout performed, Emergency Drugs available and Suction available Patient Re-evaluated:Patient Re-evaluated prior to inductionOxygen Delivery Method: Circle System Utilized Preoxygenation: Pre-oxygenation with 100% oxygen Intubation Type: IV induction, Rapid sequence and Cricoid Pressure applied Laryngoscope Size: Miller and 2 Grade View: Grade I Tube type: Oral Tube size: 7.0 mm Number of attempts: 1 Airway Equipment and Method: stylet Placement Confirmation: ETT inserted through vocal cords under direct vision,  positive ETCO2 and breath sounds checked- equal and bilateral Secured at: 21 cm Tube secured with: Tape Dental Injury: Teeth and Oropharynx as per pre-operative assessment

## 2013-02-25 NOTE — Op Note (Signed)
Patient:  [name]  DOB:  [birth year]  MRN:  [id number]   Preop Diagnosis:  Acute cholecystitis, cholelithiasis  Postop Diagnosis:  Same  Procedure:  Laparoscopic cholecystectomy  Surgeon:  Franky Macho, M.D.  Anes:   General endotracheal  Indications:  Patient is a 62 year old white male who presented emergency room with worsening right upper quadrant abdominal pain. He is known history of gallstones and repeat ultrasound revealed acute cholecystitis with cholelithiasis. The risks and benefits of the procedure including bleeding, infection, hepatobiliary injury, and the possibility of an open procedure were fully explained to the patient, who gave informed consent.  Procedure note:  The patient's placed the supine position. After induction of general endotracheal anesthesia, the abdomen was prepped and draped using usual sterile technique with DuraPrep. Surgical site confirmation was performed.  A supraumbilical incision was made down to the fascia. A Veress needle was introduced into the abdominal cavity and confirmation of placement was done using the saline drop test. The abdomen was then insufflated to 16 mm mercury pressure. An 11 mm trocar was introduced into the abdominal cavity under direct visualization without difficulty. The patient was placed in reverse Trendelenburg position and additional 11 mm trocar was placed the epigastric region. 5 mm trochars were placed in the midline, right upper quadrant, and right flank regions. The liver was inspected and noted within normal limits. The gallbladder was noted to be distended with a thickened gallbladder wall. The gallbladder wall was edematous. In order to facilitate exposure of the triangle of Calot, the gallbladder was decompressed with suction. The gallbladder was retracted in a dynamic fashion. The cystic duct was first identified. Its junction to the infundibulum flow identified. Endoclips were placed proximally distally on  the cystic duct, and the cystic duct was divided. This was likewise done cystic artery. The gallbladder was then freed away from the gallbladder fossa using Bovie electrocautery. The gallbladder delivered through the epigastric trocar site using an Endo Catch bag. Given the size of the gallbladder and gallstone, the epigastric incision had to be extended. The gallbladder fossa was inspected and any bleeding was controlled using Bovie electrocautery. Surgicel is placed the gallbladder fossa. All fluid and air were then evacuated from the abdominal cavity prior to removal of the trochars.  All wounds were irrigated with normal saline. All wounds were checked with 0.5% Sensorcaine. The supraumbilical fascia as well as epigastric fascia were reapproximated using 0 Vicryl interrupted sutures. All skin incisions were closed using staples. Betadine ointment and dry sterile dressings were applied.  All tape and needle counts were correct at the end of the procedure. Patient was extubated in the operating room and transferred to PACU in stable. condition.  Complications:  None  EBL:  100 cc  Specimen:  Gallbladder

## 2013-02-25 NOTE — Anesthesia Postprocedure Evaluation (Addendum)
  Anesthesia Post-op Note  Patient: Caleb Reed  Procedure(s) Performed: Procedure(s): LAPAROSCOPIC CHOLECYSTECTOMY (N/A)  Patient Location: nursing unit  Anesthesia Type:General  Level of Consciousness: awake and patient cooperative  Airway and Oxygen Therapy: Patient Spontanous Breathing nasal cannula  Post-op Pain: mild  Post-op Assessment: Post-op Vital signs reviewed, Patient's Cardiovascular Status Stable and Respiratory Function Stable  Post-op Vital Signs: Reviewed and stable  Complications: No apparent anesthesia complications

## 2013-02-26 LAB — BASIC METABOLIC PANEL
BUN: 11 mg/dL (ref 6–23)
CO2: 34 mEq/L — ABNORMAL HIGH (ref 19–32)
Chloride: 99 mEq/L (ref 96–112)
Creatinine, Ser: 0.89 mg/dL (ref 0.50–1.35)
GFR calc Af Amer: >90 mL/min (ref >90)
Glucose, Bld: 128 mg/dL — ABNORMAL HIGH (ref 70–99)

## 2013-02-26 LAB — HEPATIC FUNCTION PANEL
ALT: 40 U/L (ref 0–53)
AST: 37 U/L (ref 0–37)
Albumin: 2.4 g/dL — ABNORMAL LOW (ref 3.5–5.2)
Bilirubin, Direct: 0.2 mg/dL (ref 0.0–0.3)
Indirect Bilirubin: 0.2 mg/dL — ABNORMAL LOW (ref 0.3–0.9)
Total Protein: 5.5 g/dL — ABNORMAL LOW (ref 6.0–8.3)

## 2013-02-26 MED ORDER — OXYCODONE-ACETAMINOPHEN 7.5-325 MG PO TABS: 1.0000 | ORAL_TABLET | ORAL | Status: DC | PRN

## 2013-02-26 NOTE — Progress Notes (Signed)
Patient given discharge instructions along with followup appointments and prescriptions.  Pt. Verbalized understanding of all instructions.  Pt. Was escorted by staff via w/c to vehicle.  Pt. Discharged in stable condition.

## 2013-02-26 NOTE — Discharge Summary (Signed)
Physician Discharge Summary  Patient ID: BLADE SCHEFF MRN: [id number] DOB/AGE: Oct 26, 1950 62 y.o.  Admit date: 02/24/2013 Discharge date: 02/26/2013  Admission Diagnoses: Acute cholecystitis, cholelithiasis  Discharge Diagnoses: Same Active Problems:   * No active hospital problems. *   Discharged Condition: good  Hospital Course: Patient is a 62 year old white male who presented emergency room with worsening right upper quadrant abdominal pain. Ultrasound of gallbladder revealed cholelithiasis with a thickened gallbladder wall. He was admitted to the hospital for control of his pain and nausea. He subsequently underwent laparoscopic cholecystectomy on 02/25/2013. Tolerated procedure well. Postoperative course was unremarkable. His diet was advanced at difficulty. He is being discharged home on 02/26/2013 in good improving condition.  Treatments: surgery: Laparoscopic cholecystectomy on 02/25/2013  Discharge Exam: Blood pressure 95/56, pulse 74, temperature 99.4 F (37.4 C), temperature source Oral, resp. rate 20, height 6' (1.829 m), weight 112.492 kg (248 lb), SpO2 95.00%. General appearance: alert, cooperative and Sitting up in the bed without any lightheadedness. Resp: clear to auscultation bilaterally Cardio: regular rate and rhythm, S1, S2 normal, no murmur, click, rub or gallop GI: Soft. Dressings dry and intact. No bowel distention noted.  Disposition: 01-Home or Self Care     Medication List    STOP taking these medications       HYDROcodone-acetaminophen 7.5-325 MG per tablet  Commonly known as:  NORCO      TAKE these medications       acetaminophen 500 MG tablet  Commonly known as:  TYLENOL  Take 500 mg by mouth as needed. For pain     allopurinol 100 MG tablet  Commonly known as:  ZYLOPRIM  Take 100 mg by mouth daily.     cetirizine 10 MG tablet  Commonly known as:  ZYRTEC  Take 10 mg by mouth daily as needed for allergies.     CULTURELLE PO   Take by mouth daily.     fexofenadine 180 MG tablet  Commonly known as:  ALLEGRA  Take 180 mg by mouth daily.     ibuprofen 200 MG tablet  Commonly known as:  ADVIL,MOTRIN  Take 200 mg by mouth as needed. For pain     lansoprazole 15 MG capsule  Commonly known as:  PREVACID  Take 15 mg by mouth daily.     loperamide 2 MG tablet  Commonly known as:  IMODIUM A-D  Take 1 tablet (2 mg total) by mouth daily before breakfast.     ondansetron 4 MG disintegrating tablet  Commonly known as:  ZOFRAN-ODT  Take 4 mg by mouth every 6 (six) hours as needed for nausea.     oxyCODONE-acetaminophen 7.5-325 MG per tablet  Commonly known as:  PERCOCET  Take 1-2 tablets by mouth every 4 (four) hours as needed.     silodosin 8 MG Caps capsule  Commonly known as:  RAPAFLO  Take 8 mg by mouth daily with breakfast.           Follow-up Information   Follow up with Dalia Heading, MD. Schedule an appointment as soon as possible for a visit on 03/08/2013.   Specialty:  General Surgery   Contact information:   [address] [place] [address] [phone]       Signed: Franky Macho A 02/26/2013, 7:09 AM

## 2013-03-04 ENCOUNTER — Encounter (INDEPENDENT_AMBULATORY_CARE_PROVIDER_SITE_OTHER): Payer: Self-pay

## 2013-04-04 ENCOUNTER — Encounter (INDEPENDENT_AMBULATORY_CARE_PROVIDER_SITE_OTHER): Payer: Self-pay | Admitting: Internal Medicine

## 2013-04-04 ENCOUNTER — Ambulatory Visit (INDEPENDENT_AMBULATORY_CARE_PROVIDER_SITE_OTHER): Payer: BC Managed Care – PPO | Admitting: Internal Medicine

## 2013-04-04 VITALS — BP 130/80 | HR 76 | Temp 98.0°F | Resp 18 | Ht 72.0 in | Wt 248.9 lb

## 2013-04-04 DIAGNOSIS — Z860100 Personal history of colon polyps, unspecified: Secondary | ICD-10-CM

## 2013-04-04 DIAGNOSIS — K589 Irritable bowel syndrome without diarrhea: Secondary | ICD-10-CM

## 2013-04-04 NOTE — Progress Notes (Signed)
Presenting complaint;  Followup for chronic diarrhea.  Database;  Patient is 62 year old Caucasian male with history of diarrhea which started sometime in February this year after he took an antibiotic for dental infection. Stool studies were negative. He underwent colonoscopy on 12/29/2012 with removal of 3 small polyps in transverse colon and these were tubular adenomas. He also had few diverticula at sigmoid colon. Random biopsy from sigmoid colon revealed normal mucosa. Patient was advised to take Imodium OTC milligrams every morning and continue high-fiber diet.  Subjective;  Patient reports that he is feeling better. Now he is having diarrhea 2-3 times a week rather than every day. On days when he has diarrhea he has 3-4 loose stools. He has urgency. He denies nocturnal diarrhea, melena or rectal bleeding. He is using Imodium on as-needed basis and he states it may or may not help him. He had cholecystectomy about 6 weeks ago but he does not believe it made his diarrhea worse. He has good appetite and his weight has been stable. He does complain of daily nocturia. He states before he started to have diarrhea his bowels will move every evening on schedule.   Current Medications: Current Outpatient Prescriptions  Medication Sig Dispense Refill  . acetaminophen (TYLENOL) 500 MG tablet Take 500 mg by mouth as needed. For pain       . ibuprofen (ADVIL,MOTRIN) 200 MG tablet Take 200 mg by mouth as needed. For pain       . loperamide (IMODIUM A-D) 2 MG tablet Take 1 tablet (2 mg total) by mouth daily before breakfast.  30 tablet  0  . ondansetron (ZOFRAN-ODT) 4 MG disintegrating tablet Take 4 mg by mouth every 6 (six) hours as needed for nausea.      . silodosin (RAPAFLO) 8 MG CAPS capsule Take 8 mg by mouth daily with breakfast.       No current facility-administered medications for this visit.     Objective: Blood pressure 130/80, pulse 76, temperature 98 F (36.7 C), temperature  source Oral, resp. rate 18, height 6' (1.829 m), weight 248 lb 14.4 oz (112.9 kg). Patient is alert and in no acute distress. Conjunctiva is pink. Sclera is nonicteric Oropharyngeal mucosa is normal. No neck masses or thyromegaly noted. Abdomen. He has well-healed laparoscopy scars. Bowel sounds normal. Abdomen is soft and nontender without organomegaly or masses. No LE edema or clubbing noted.    Assessment:  #1. Diarrhea. Symptoms typical of IBS and may have been treated with antibiotic use. Recent colonoscopy negative for microscopic or collagenous colitis. He is slowly getting better. #2. History of colonic adenomas. He had 3 small polyps removed on recent colonoscopy and these were tubular adenomas.   Plan:  Continue high-fiber diet and use Imodium on an as-needed basis. Call the office with progress report in 3 months. Next colonoscopy in September 2019.

## 2013-04-04 NOTE — Patient Instructions (Signed)
Continue high fiber diet. Call with progress report in 3 months.

## 2013-05-16 ENCOUNTER — Ambulatory Visit (HOSPITAL_COMMUNITY)
Admission: RE | Admit: 2013-05-16 | Discharge: 2013-05-16 | Disposition: A | Discharge status: Home / Self Care | Payer: Medicare Other | Source: Ambulatory Visit | Attending: Family Medicine | Admitting: Family Medicine

## 2013-05-16 ENCOUNTER — Other Ambulatory Visit (HOSPITAL_COMMUNITY): Payer: Self-pay | Admitting: Family Medicine

## 2013-05-16 DIAGNOSIS — M47814 Spondylosis without myelopathy or radiculopathy, thoracic region: Secondary | ICD-10-CM | POA: Diagnosis not present

## 2013-05-16 DIAGNOSIS — R05 Cough: Secondary | ICD-10-CM

## 2013-05-16 DIAGNOSIS — R0609 Other forms of dyspnea: Secondary | ICD-10-CM | POA: Diagnosis not present

## 2013-05-16 DIAGNOSIS — R059 Cough, unspecified: Secondary | ICD-10-CM | POA: Insufficient documentation

## 2013-05-16 DIAGNOSIS — J309 Allergic rhinitis, unspecified: Secondary | ICD-10-CM | POA: Diagnosis not present

## 2013-09-02 ENCOUNTER — Encounter (HOSPITAL_COMMUNITY): Payer: Self-pay | Admitting: Emergency Medicine

## 2013-09-02 ENCOUNTER — Emergency Department (HOSPITAL_COMMUNITY)
Admission: EM | Admit: 2013-09-02 | Discharge: 2013-09-02 | Disposition: A | Discharge status: Home / Self Care | Payer: Medicare Other | Attending: Emergency Medicine | Admitting: Emergency Medicine

## 2013-09-02 DIAGNOSIS — R002 Palpitations: Secondary | ICD-10-CM | POA: Diagnosis not present

## 2013-09-02 DIAGNOSIS — M129 Arthropathy, unspecified: Secondary | ICD-10-CM | POA: Diagnosis not present

## 2013-09-02 DIAGNOSIS — Z8719 Personal history of other diseases of the digestive system: Secondary | ICD-10-CM | POA: Insufficient documentation

## 2013-09-02 DIAGNOSIS — Z85828 Personal history of other malignant neoplasm of skin: Secondary | ICD-10-CM | POA: Diagnosis not present

## 2013-09-02 DIAGNOSIS — N400 Enlarged prostate without lower urinary tract symptoms: Secondary | ICD-10-CM | POA: Insufficient documentation

## 2013-09-02 DIAGNOSIS — Z87891 Personal history of nicotine dependence: Secondary | ICD-10-CM | POA: Insufficient documentation

## 2013-09-02 DIAGNOSIS — Z7982 Long term (current) use of aspirin: Secondary | ICD-10-CM | POA: Diagnosis not present

## 2013-09-02 LAB — BASIC METABOLIC PANEL
BUN: 10 mg/dL (ref 6–23)
CO2: 30 mEq/L (ref 19–32)
Calcium: 9.1 mg/dL (ref 8.4–10.5)
Chloride: 100 mEq/L (ref 96–112)
Creatinine, Ser: 1.14 mg/dL (ref 0.50–1.35)
GFR calc Af Amer: 76 mL/min — ABNORMAL LOW (ref >90)
GFR calc non Af Amer: 66 mL/min — ABNORMAL LOW (ref >90)
Glucose, Bld: 114 mg/dL — ABNORMAL HIGH (ref 70–99)
Potassium: 3.7 mEq/L (ref 3.7–5.3)
Sodium: 141 mEq/L (ref 137–147)

## 2013-09-02 LAB — CBC
HCT: 45.1 % (ref 39.0–52.0)
Hemoglobin: 15.1 g/dL (ref 13.0–17.0)
MCH: 29.6 pg (ref 26.0–34.0)
MCHC: 33.5 g/dL (ref 30.0–36.0)
MCV: 88.4 fL (ref 78.0–100.0)
Platelets: 245 10*3/uL (ref 150–400)
RBC: 5.1 MIL/uL (ref 4.22–5.81)
RDW: 13.2 % (ref 11.5–15.5)
WBC: 8.2 10*3/uL (ref 4.0–10.5)

## 2013-09-02 LAB — URINALYSIS, ROUTINE W REFLEX MICROSCOPIC
Glucose, UA: NEGATIVE mg/dL
Hgb urine dipstick: NEGATIVE
Ketones, ur: NEGATIVE mg/dL
Leukocytes, UA: NEGATIVE
Nitrite: NEGATIVE
Protein, ur: NEGATIVE mg/dL
Specific Gravity, Urine: >1.03 — ABNORMAL HIGH (ref 1.005–1.030)
Urobilinogen, UA: 0.2 mg/dL (ref 0.0–1.0)
pH: 5.5 (ref 5.0–8.0)

## 2013-09-02 LAB — TROPONIN I: Troponin I: <0.3 ng/mL (ref <0.30)

## 2013-09-02 NOTE — ED Notes (Signed)
Irregular heart  Beat, onset this am.  No chest pain No sob

## 2013-09-02 NOTE — ED Notes (Signed)
Pt states he was working out in the heat all day when he felt his heart palpations, states they have resolved since he has been here.

## 2013-09-02 NOTE — Discharge Instructions (Signed)
Palpitations   A palpitation is the feeling that your heartbeat is irregular or is faster than normal. It may feel like your heart is fluttering or skipping a beat. Palpitations are usually not a serious problem. However, in some cases, you may need further medical evaluation.  CAUSES   Palpitations can be caused by:   Smoking.   Caffeine or other stimulants, such as diet pills or energy drinks.   Alcohol.   Stress and anxiety.   Strenuous physical activity.   Fatigue.   Certain medicines.   Heart disease, especially if you have a history of arrhythmias. This includes atrial fibrillation, atrial flutter, or supraventricular tachycardia.   An improperly working pacemaker or defibrillator.  DIAGNOSIS   To find the cause of your palpitations, your caregiver will take your history and perform a physical exam. Tests may also be done, including:   Electrocardiography (ECG). This test records the heart's electrical activity.   Cardiac monitoring. This allows your caregiver to monitor your heart rate and rhythm in real time.   Holter monitor. This is a portable device that records your heartbeat and can help diagnose heart arrhythmias. It allows your caregiver to track your heart activity for several days, if needed.   Stress tests by exercise or by giving medicine that makes the heart beat faster.  TREATMENT   Treatment of palpitations depends on the cause of your symptoms and can vary greatly. Most cases of palpitations do not require any treatment other than time, relaxation, and monitoring your symptoms. Other causes, such as atrial fibrillation, atrial flutter, or supraventricular tachycardia, usually require further treatment.  HOME CARE INSTRUCTIONS    Avoid:   Caffeinated coffee, tea, soft drinks, diet pills, and energy drinks.   Chocolate.   Alcohol.   Stop smoking if you smoke.   Reduce your stress and anxiety. Things that can help you relax include:   A method that measures bodily functions so  you can learn to control them (biofeedback).   Yoga.   Meditation.   Physical activity such as swimming, jogging, or walking.   Get plenty of rest and sleep.  SEEK MEDICAL CARE IF:    You continue to have a fast or irregular heartbeat beyond 24 hours.   Your palpitations occur more often.  SEEK IMMEDIATE MEDICAL CARE IF:   You develop chest pain or shortness of breath.   You have a severe headache.   You feel dizzy, or you faint.  MAKE SURE YOU:   Understand these instructions.   Will watch your condition.   Will get help right away if you are not doing well or get worse.  Document Released: 04/11/2000 Document Revised: 08/09/2012 Document Reviewed: 06/13/2011  [employer] Patient Information 2014 [employer].

## 2013-09-04 NOTE — ED Provider Notes (Signed)
CSN: [id number]     Arrival date & time 09/02/13  1755 History   First MD Initiated Contact with Patient 09/02/13 1834     No chief complaint on file.    (Consider location/radiation/quality/duration/timing/severity/associated sxs/prior Treatment) HPI  63 year old male with palpitations. Onset earlier today while at work. He has a strenuous job working has agreed to your. Initially his heart was beating extremely fast. At times her regularly. Symptoms persisted throughout the day. It resolved shortly after getting home and resting. He currently has no complaints. He has had an episode of similar palpitations which resolve without any intervention. He did not seek evaluation at that time. Denies any chest pain or pain in rales. Not associated with any shortness of breath, diaphoresis or nausea. No recent medication changes. Denies drug use. No significant caffeine intake. Past Medical History  Diagnosis Date  . Gallstones   . Arthritis   . Elevated uric acid in blood   . BPH (benign prostatic hyperplasia)   . PONV (postoperative nausea and vomiting)   . GERD (gastroesophageal reflux disease)   . Cancer     skin ca   Past Surgical History  Procedure Laterality Date  . Eye surgery      Shit with an arrow. Can see some out of his eye  . Colonoscopy N/A 12/29/2012    Procedure: COLONOSCOPY;  Surgeon: Rogene Houston, MD;  Location: [facility];  Service: Endoscopy;  Laterality: N/A;  300-moved to 200 Ann to notify pt  . Cholecystectomy N/A 02/25/2013    Procedure: LAPAROSCOPIC CHOLECYSTECTOMY;  Surgeon: Jamesetta So, MD;  Location: [facility];  Service: General;  Laterality: N/A;   Family History  Problem Relation Age of Onset  . Colon cancer Neg Hx    History  Substance Use Topics  . Smoking status: Former Research scientist (life sciences)  . Smokeless tobacco: Current User    Types: Chew     Comment: quit years ago  . Alcohol Use: Yes     Comment: occasionally wine, not often    Review of Systems  All  systems reviewed and negative, other than as noted in HPI.    Allergies  Etodolac and Hydrocodone  Home Medications   Prior to Admission medications   Medication Sig Start Date End Date Taking? Authorizing Provider  aspirin EC 81 MG tablet Take 81 mg by mouth daily as needed for mild pain or moderate pain.   Yes Historical Provider, MD  fexofenadine (ALLEGRA) 180 MG tablet Take 180 mg by mouth every evening.   Yes Historical Provider, MD  loperamide (IMODIUM) 2 MG capsule Take 2 mg by mouth as needed for diarrhea or loose stools.   Yes Historical Provider, MD  silodosin (RAPAFLO) 8 MG CAPS capsule Take 8 mg by mouth every evening.    Yes Historical Provider, MD   BP 141/79  Pulse 65  Temp(Src) 98.3 F (36.8 C) (Oral)  Resp 10  Ht 6' (1.829 m)  Wt 255 lb (115.667 kg)  BMI 34.58 kg/m2  SpO2 96% Physical Exam  Nursing note and vitals reviewed. Constitutional: He appears well-developed and well-nourished. No distress.  HENT:  Head: Normocephalic and atraumatic.  Eyes: Conjunctivae are normal. Right eye exhibits no discharge. Left eye exhibits no discharge.  Neck: Neck supple.  Cardiovascular: Normal rate, regular rhythm and normal heart sounds.  Exam reveals no gallop and no friction rub.   No murmur heard. Pulmonary/Chest: Effort normal and breath sounds normal. No respiratory distress.  Abdominal: Soft. He  exhibits no distension. There is no tenderness.  Musculoskeletal: He exhibits no edema and no tenderness.  Lower extremities symmetric as compared to each other. No calf tenderness. Negative Homan's. No palpable cords.   Neurological: He is alert.  Skin: Skin is warm and dry.  Psychiatric: He has a normal mood and affect. His behavior is normal. Thought content normal.    ED Course  Procedures (including critical care time) Labs Review Labs Reviewed  BASIC METABOLIC PANEL - Abnormal; Notable for the following:    Glucose, Bld 114 (*)    GFR calc non Af Amer 66 (*)     GFR calc Af Amer 76 (*)    All other components within normal limits  URINALYSIS, ROUTINE W REFLEX MICROSCOPIC - Abnormal; Notable for the following:    Color, Urine AMBER (*)    APPearance CLOUDY (*)    Specific Gravity, Urine >1.030 (*)    Bilirubin Urine SMALL (*)    All other components within normal limits  CBC  TROPONIN I    Imaging Review No results found.   EKG Interpretation   Date/Time:  Friday Sep 02 2013 18:15:15 EDT Ventricular Rate:  76 PR Interval:  150 QRS Duration: 84 QT Interval:  368 QTC Calculation: 414 R Axis:   61 Text Interpretation:  Sinus rhythm Borderline low voltage, extremity leads  Nonspecific T abnrm, anterolateral leads No significant change since last  tracing Confirmed by [place] ([id number]) on 09/02/2013 6:58:09 PM      MDM   Final diagnoses:  Heart palpitations    63 year old male with palpitations since resolved. He is hemodynamically stable. EKG shows normal intervals. Workup fairly unremarkable. Feel is appropriate for discharge at this time. Return precautions were discussed. Outpatient followup with his PCP and discuss possible Holter monitor if symptoms persist.    Virgel Manifold, MD 09/04/13 2128

## 2013-09-05 DIAGNOSIS — I499 Cardiac arrhythmia, unspecified: Secondary | ICD-10-CM | POA: Diagnosis not present

## 2013-09-12 ENCOUNTER — Ambulatory Visit (HOSPITAL_COMMUNITY)
Admission: RE | Admit: 2013-09-12 | Discharge: 2013-09-12 | Disposition: A | Discharge status: Home / Self Care | Payer: Medicare Other | Source: Ambulatory Visit | Attending: Family Medicine | Admitting: Family Medicine

## 2013-09-12 DIAGNOSIS — R002 Palpitations: Secondary | ICD-10-CM | POA: Diagnosis not present

## 2013-09-12 NOTE — Progress Notes (Signed)
48 hour Holter Monitor in progress. 

## 2013-09-16 ENCOUNTER — Other Ambulatory Visit: Payer: Self-pay | Admitting: *Deleted

## 2013-09-16 DIAGNOSIS — R002 Palpitations: Secondary | ICD-10-CM

## 2014-04-04 DIAGNOSIS — Z0000 Encounter for general adult medical examination without abnormal findings: Secondary | ICD-10-CM | POA: Diagnosis not present

## 2014-04-04 DIAGNOSIS — Z125 Encounter for screening for malignant neoplasm of prostate: Secondary | ICD-10-CM | POA: Diagnosis not present

## 2014-04-04 DIAGNOSIS — Z79899 Other long term (current) drug therapy: Secondary | ICD-10-CM | POA: Diagnosis not present

## 2014-04-04 DIAGNOSIS — E785 Hyperlipidemia, unspecified: Secondary | ICD-10-CM | POA: Diagnosis not present

## 2014-08-01 DIAGNOSIS — K091 Developmental (nonodontogenic) cysts of oral region: Secondary | ICD-10-CM | POA: Diagnosis not present

## 2014-08-11 DIAGNOSIS — K091 Developmental (nonodontogenic) cysts of oral region: Secondary | ICD-10-CM | POA: Diagnosis not present

## 2014-08-17 DIAGNOSIS — K091 Developmental (nonodontogenic) cysts of oral region: Secondary | ICD-10-CM | POA: Diagnosis not present

## 2014-08-22 DIAGNOSIS — D165 Benign neoplasm of lower jaw bone: Secondary | ICD-10-CM | POA: Diagnosis not present

## 2014-10-26 DIAGNOSIS — H04123 Dry eye syndrome of bilateral lacrimal glands: Secondary | ICD-10-CM | POA: Diagnosis not present

## 2015-02-01 DIAGNOSIS — D0339 Melanoma in situ of other parts of face: Secondary | ICD-10-CM | POA: Diagnosis not present

## 2015-02-01 DIAGNOSIS — L718 Other rosacea: Secondary | ICD-10-CM | POA: Diagnosis not present

## 2015-02-01 DIAGNOSIS — L570 Actinic keratosis: Secondary | ICD-10-CM | POA: Diagnosis not present

## 2015-02-01 DIAGNOSIS — X32XXXD Exposure to sunlight, subsequent encounter: Secondary | ICD-10-CM | POA: Diagnosis not present

## 2015-02-01 DIAGNOSIS — D225 Melanocytic nevi of trunk: Secondary | ICD-10-CM | POA: Diagnosis not present

## 2015-02-12 DIAGNOSIS — L821 Other seborrheic keratosis: Secondary | ICD-10-CM | POA: Diagnosis not present

## 2015-02-12 DIAGNOSIS — B079 Viral wart, unspecified: Secondary | ICD-10-CM | POA: Diagnosis not present

## 2015-02-12 DIAGNOSIS — D0339 Melanoma in situ of other parts of face: Secondary | ICD-10-CM | POA: Diagnosis not present

## 2015-05-21 DIAGNOSIS — Z08 Encounter for follow-up examination after completed treatment for malignant neoplasm: Secondary | ICD-10-CM | POA: Diagnosis not present

## 2015-05-21 DIAGNOSIS — Z85820 Personal history of malignant melanoma of skin: Secondary | ICD-10-CM | POA: Diagnosis not present

## 2015-05-21 DIAGNOSIS — Z1283 Encounter for screening for malignant neoplasm of skin: Secondary | ICD-10-CM | POA: Diagnosis not present

## 2015-07-24 DIAGNOSIS — M159 Polyosteoarthritis, unspecified: Secondary | ICD-10-CM | POA: Diagnosis not present

## 2015-07-24 DIAGNOSIS — Q5562 Hypoplasia of penis: Secondary | ICD-10-CM | POA: Diagnosis not present

## 2015-08-28 DIAGNOSIS — Z86018 Personal history of other benign neoplasm: Secondary | ICD-10-CM | POA: Diagnosis not present

## 2015-09-10 DIAGNOSIS — H2513 Age-related nuclear cataract, bilateral: Secondary | ICD-10-CM | POA: Diagnosis not present

## 2015-09-10 DIAGNOSIS — H538 Other visual disturbances: Secondary | ICD-10-CM | POA: Diagnosis not present

## 2015-09-11 ENCOUNTER — Ambulatory Visit (INDEPENDENT_AMBULATORY_CARE_PROVIDER_SITE_OTHER): Payer: Medicare Other | Admitting: Urology

## 2015-09-11 DIAGNOSIS — R3129 Other microscopic hematuria: Secondary | ICD-10-CM

## 2015-09-11 DIAGNOSIS — Q5562 Hypoplasia of penis: Secondary | ICD-10-CM | POA: Diagnosis not present

## 2015-09-11 DIAGNOSIS — R5383 Other fatigue: Secondary | ICD-10-CM | POA: Diagnosis not present

## 2015-09-11 DIAGNOSIS — N4883 Acquired buried penis: Secondary | ICD-10-CM | POA: Diagnosis not present

## 2015-09-11 DIAGNOSIS — N5201 Erectile dysfunction due to arterial insufficiency: Secondary | ICD-10-CM

## 2015-09-11 DIAGNOSIS — N401 Enlarged prostate with lower urinary tract symptoms: Secondary | ICD-10-CM

## 2015-09-18 DIAGNOSIS — S20369A Insect bite (nonvenomous) of unspecified front wall of thorax, initial encounter: Secondary | ICD-10-CM | POA: Diagnosis not present

## 2015-09-18 DIAGNOSIS — N400 Enlarged prostate without lower urinary tract symptoms: Secondary | ICD-10-CM | POA: Diagnosis not present

## 2015-10-10 DIAGNOSIS — R7301 Impaired fasting glucose: Secondary | ICD-10-CM | POA: Diagnosis not present

## 2015-10-10 DIAGNOSIS — I10 Essential (primary) hypertension: Secondary | ICD-10-CM | POA: Diagnosis not present

## 2015-10-10 DIAGNOSIS — E039 Hypothyroidism, unspecified: Secondary | ICD-10-CM | POA: Diagnosis not present

## 2015-10-10 DIAGNOSIS — E782 Mixed hyperlipidemia: Secondary | ICD-10-CM | POA: Diagnosis not present

## 2015-10-12 DIAGNOSIS — N400 Enlarged prostate without lower urinary tract symptoms: Secondary | ICD-10-CM | POA: Diagnosis not present

## 2015-10-12 DIAGNOSIS — R7301 Impaired fasting glucose: Secondary | ICD-10-CM | POA: Diagnosis not present

## 2016-04-01 DIAGNOSIS — R7301 Impaired fasting glucose: Secondary | ICD-10-CM | POA: Diagnosis not present

## 2016-04-01 DIAGNOSIS — N400 Enlarged prostate without lower urinary tract symptoms: Secondary | ICD-10-CM | POA: Diagnosis not present

## 2016-04-01 DIAGNOSIS — I10 Essential (primary) hypertension: Secondary | ICD-10-CM | POA: Diagnosis not present

## 2016-04-25 ENCOUNTER — Encounter (INDEPENDENT_AMBULATORY_CARE_PROVIDER_SITE_OTHER): Payer: Self-pay | Admitting: Internal Medicine

## 2016-04-25 ENCOUNTER — Ambulatory Visit (INDEPENDENT_AMBULATORY_CARE_PROVIDER_SITE_OTHER): Payer: Medicare Other | Admitting: Internal Medicine

## 2016-04-25 VITALS — BP 128/80 | HR 60 | Temp 97.7°F | Ht 69.0 in | Wt 249.1 lb

## 2016-04-25 DIAGNOSIS — K588 Other irritable bowel syndrome: Secondary | ICD-10-CM

## 2016-04-25 MED ORDER — DICYCLOMINE HCL 10 MG PO CAPS: 10.0000 mg | ORAL_CAPSULE | Freq: Three times a day (TID) | ORAL | 3 refills | Status: DC

## 2016-04-25 NOTE — Patient Instructions (Signed)
Rx for Dicyclomine sent to her pharmacy.  

## 2016-04-25 NOTE — Progress Notes (Signed)
   Subjective:    Patient ID: Caleb Reed, male    DOB: [birth year], 65 y.o.   MRN: [id number]  HPI Here today with c/o GERD. Last seen in 2013. States he stays sick on his stomach. He says when he eats he has to go have a BM. Symptoms since having his GB removed in 2013. Stools are usually formed but some can be loose. Usually has 3-4 stools a day and sometimes he will skip a day or two. He denies having any acid reflux. Takes Zantac daily.  Usually has some nausea after eating but does not vomit.     12/29/2012: Procedure:   Colonoscopy  Indications:  Patient is 65 year old Caucasian male who is here for screening colonoscopy. He does give seven months history of diarrhea. Stool studies were  negative. Diarrhea started when he took antibiotic for dental infection and has continued. Family history is negative for CRC.   Impression:  Examination performed to cecum. Three small polyps ablated while cold biopsy from transverse colon and submitted together. Mild sigmoid colon diverticulosis. No evidence of endoscopic colitis. Random biopsies taken from mucosa of sigmoid colon looking for microscopic or collagenous colitis.  All 3 polyps are tubular adenomas. These are small polyps. Biopsy from mucosa of sigmoid colon negative for colitis. Patient will continue Imodium 1-2 mg a day fiber supplement and probiotic and return for office visit in 8 weeks. He can take OTC Zantac twice a day   Review of Systems Past Medical History:  Diagnosis Date  . Arthritis   . BPH (benign prostatic hyperplasia)   . Cancer ([place])    skin ca  . Elevated uric acid in blood   . Gallstones   . GERD (gastroesophageal reflux disease)   . PONV (postoperative nausea and vomiting)     Past Surgical History:  Procedure Laterality Date  . CHOLECYSTECTOMY N/A 02/25/2013   Procedure: LAPAROSCOPIC CHOLECYSTECTOMY;  Surgeon: Jamesetta So, MD;  Location: [facility];  Service: General;  Laterality: N/A;  .  COLONOSCOPY N/A 12/29/2012   Procedure: COLONOSCOPY;  Surgeon: Rogene Houston, MD;  Location: [facility];  Service: Endoscopy;  Laterality: N/A;  300-moved to 200 Ann to notify pt  . EYE SURGERY     Shit with an arrow. Can see some out of his eye    Allergies  Allergen Reactions  . Etodolac Hives    Hives all over  . Hydrocodone Itching    nausea    Current Outpatient Prescriptions on File Prior to Visit  Medication Sig Dispense Refill  . loperamide (IMODIUM) 2 MG capsule Take 2 mg by mouth as needed for diarrhea or loose stools.     No current facility-administered medications on file prior to visit.        Objective:   Physical Exam Blood pressure 128/80, pulse 60, temperature 97.7 F (36.5 C), height 5\' 9"  (1.753 m), weight 249 lb 1.6 oz (113 kg). Alert and oriented. Skin warm and dry. Oral mucosa is moist.   . Sclera anicteric, conjunctivae is pink. Thyroid not enlarged. No cervical lymphadenopathy. Lungs clear. Heart regular rate and rhythm.  Abdomen is soft. Bowel sounds are positive. No hepatomegaly. No abdominal masses felt. No tenderness.  No edema to lower extremities.          Assessment & Plan:  Probable IBS. Am going to try him on Dicyclomine 10mg  TID. Will see him back in 8 weeks to see how he is doing.

## 2016-05-12 DIAGNOSIS — Z6835 Body mass index (BMI) 35.0-35.9, adult: Secondary | ICD-10-CM | POA: Diagnosis not present

## 2016-05-12 DIAGNOSIS — M25569 Pain in unspecified knee: Secondary | ICD-10-CM | POA: Diagnosis not present

## 2016-05-12 DIAGNOSIS — M545 Low back pain: Secondary | ICD-10-CM | POA: Diagnosis not present

## 2016-05-12 DIAGNOSIS — N400 Enlarged prostate without lower urinary tract symptoms: Secondary | ICD-10-CM | POA: Diagnosis not present

## 2016-05-12 DIAGNOSIS — Z0000 Encounter for general adult medical examination without abnormal findings: Secondary | ICD-10-CM | POA: Diagnosis not present

## 2016-05-12 DIAGNOSIS — R7301 Impaired fasting glucose: Secondary | ICD-10-CM | POA: Diagnosis not present

## 2016-06-20 ENCOUNTER — Ambulatory Visit (INDEPENDENT_AMBULATORY_CARE_PROVIDER_SITE_OTHER): Payer: Medicare Other | Admitting: Internal Medicine

## 2016-10-07 ENCOUNTER — Ambulatory Visit (INDEPENDENT_AMBULATORY_CARE_PROVIDER_SITE_OTHER): Payer: Medicare Other | Admitting: Urology

## 2016-10-07 DIAGNOSIS — N401 Enlarged prostate with lower urinary tract symptoms: Secondary | ICD-10-CM

## 2016-10-07 DIAGNOSIS — N5201 Erectile dysfunction due to arterial insufficiency: Secondary | ICD-10-CM

## 2016-12-19 ENCOUNTER — Ambulatory Visit (INDEPENDENT_AMBULATORY_CARE_PROVIDER_SITE_OTHER): Payer: Medicare Other | Admitting: Urology

## 2016-12-19 DIAGNOSIS — R3915 Urgency of urination: Secondary | ICD-10-CM | POA: Diagnosis not present

## 2016-12-19 DIAGNOSIS — N453 Epididymo-orchitis: Secondary | ICD-10-CM | POA: Diagnosis not present

## 2016-12-19 DIAGNOSIS — N401 Enlarged prostate with lower urinary tract symptoms: Secondary | ICD-10-CM | POA: Diagnosis not present

## 2016-12-19 DIAGNOSIS — E230 Hypopituitarism: Secondary | ICD-10-CM

## 2016-12-19 DIAGNOSIS — R5383 Other fatigue: Secondary | ICD-10-CM

## 2016-12-26 DIAGNOSIS — N401 Enlarged prostate with lower urinary tract symptoms: Secondary | ICD-10-CM | POA: Diagnosis not present

## 2016-12-30 DIAGNOSIS — M949 Disorder of cartilage, unspecified: Secondary | ICD-10-CM | POA: Diagnosis not present

## 2016-12-30 DIAGNOSIS — Z6836 Body mass index (BMI) 36.0-36.9, adult: Secondary | ICD-10-CM | POA: Diagnosis not present

## 2016-12-30 DIAGNOSIS — M179 Osteoarthritis of knee, unspecified: Secondary | ICD-10-CM | POA: Diagnosis not present

## 2016-12-30 DIAGNOSIS — Z79899 Other long term (current) drug therapy: Secondary | ICD-10-CM | POA: Diagnosis not present

## 2016-12-30 DIAGNOSIS — R5383 Other fatigue: Secondary | ICD-10-CM | POA: Diagnosis not present

## 2016-12-30 DIAGNOSIS — M25569 Pain in unspecified knee: Secondary | ICD-10-CM | POA: Diagnosis not present

## 2017-01-10 ENCOUNTER — Encounter (HOSPITAL_COMMUNITY): Payer: Self-pay | Admitting: *Deleted

## 2017-01-10 ENCOUNTER — Emergency Department (HOSPITAL_COMMUNITY)
Admission: EM | Admit: 2017-01-10 | Discharge: 2017-01-11 | Disposition: A | Discharge status: Home / Self Care | Payer: Medicare Other | Attending: Emergency Medicine | Admitting: Emergency Medicine

## 2017-01-10 DIAGNOSIS — N509 Disorder of male genital organs, unspecified: Secondary | ICD-10-CM | POA: Insufficient documentation

## 2017-01-10 DIAGNOSIS — R110 Nausea: Secondary | ICD-10-CM | POA: Diagnosis not present

## 2017-01-10 DIAGNOSIS — R05 Cough: Secondary | ICD-10-CM | POA: Insufficient documentation

## 2017-01-10 DIAGNOSIS — Z79899 Other long term (current) drug therapy: Secondary | ICD-10-CM | POA: Insufficient documentation

## 2017-01-10 DIAGNOSIS — R002 Palpitations: Secondary | ICD-10-CM

## 2017-01-10 DIAGNOSIS — N5089 Other specified disorders of the male genital organs: Secondary | ICD-10-CM

## 2017-01-10 DIAGNOSIS — I491 Atrial premature depolarization: Secondary | ICD-10-CM | POA: Insufficient documentation

## 2017-01-10 DIAGNOSIS — R509 Fever, unspecified: Secondary | ICD-10-CM | POA: Diagnosis present

## 2017-01-10 DIAGNOSIS — F17200 Nicotine dependence, unspecified, uncomplicated: Secondary | ICD-10-CM | POA: Insufficient documentation

## 2017-01-10 DIAGNOSIS — J3489 Other specified disorders of nose and nasal sinuses: Secondary | ICD-10-CM | POA: Insufficient documentation

## 2017-01-10 NOTE — ED Triage Notes (Signed)
Pt c/o left knee pain that has been ongoing for pt, denies any injury, c/o palpitations that have been intermittent for the past few days, fever for 2-3 days, headache and neck pain, left lower abd pain for the past two weeks,

## 2017-01-11 DIAGNOSIS — I491 Atrial premature depolarization: Secondary | ICD-10-CM | POA: Diagnosis not present

## 2017-01-11 LAB — COMPREHENSIVE METABOLIC PANEL
ALK PHOS: 100 U/L (ref 38–126)
ALT: 20 U/L (ref 17–63)
AST: 18 U/L (ref 15–41)
Albumin: 3.4 g/dL — ABNORMAL LOW (ref 3.5–5.0)
Anion gap: 8 (ref 5–15)
BILIRUBIN TOTAL: 0.4 mg/dL (ref 0.3–1.2)
BUN: 12 mg/dL (ref 6–20)
CALCIUM: 8.7 mg/dL — AB (ref 8.9–10.3)
CO2: 28 mmol/L (ref 22–32)
CREATININE: 0.86 mg/dL (ref 0.61–1.24)
Chloride: 102 mmol/L (ref 101–111)
GFR calc non Af Amer: >60 mL/min (ref >60)
GLUCOSE: 131 mg/dL — AB (ref 65–99)
Potassium: 3.6 mmol/L (ref 3.5–5.1)
Sodium: 138 mmol/L (ref 135–145)
TOTAL PROTEIN: 7.2 g/dL (ref 6.5–8.1)

## 2017-01-11 LAB — CBC WITH DIFFERENTIAL/PLATELET
BASOS ABS: 0 10*3/uL (ref 0.0–0.1)
BASOS PCT: 0 %
Eosinophils Absolute: 0.3 10*3/uL (ref 0.0–0.7)
Eosinophils Relative: 3 %
HCT: 38.1 % — ABNORMAL LOW (ref 39.0–52.0)
Hemoglobin: 12.3 g/dL — ABNORMAL LOW (ref 13.0–17.0)
Lymphocytes Relative: 16 %
Lymphs Abs: 1.6 10*3/uL (ref 0.7–4.0)
MCH: 29.4 pg (ref 26.0–34.0)
MCHC: 32.3 g/dL (ref 30.0–36.0)
MCV: 91.1 fL (ref 78.0–100.0)
Monocytes Absolute: 1 10*3/uL (ref 0.1–1.0)
Monocytes Relative: 10 %
Neutro Abs: 7.1 10*3/uL (ref 1.7–7.7)
Neutrophils Relative %: 71 %
PLATELETS: 326 10*3/uL (ref 150–400)
RBC: 4.18 MIL/uL — ABNORMAL LOW (ref 4.22–5.81)
RDW: 12.6 % (ref 11.5–15.5)
WBC: 10.1 10*3/uL (ref 4.0–10.5)

## 2017-01-11 LAB — URINALYSIS, ROUTINE W REFLEX MICROSCOPIC
BILIRUBIN URINE: NEGATIVE
Glucose, UA: NEGATIVE mg/dL
Hgb urine dipstick: NEGATIVE
Ketones, ur: NEGATIVE mg/dL
Leukocytes, UA: NEGATIVE
NITRITE: NEGATIVE
PROTEIN: NEGATIVE mg/dL
Specific Gravity, Urine: 1.011 (ref 1.005–1.030)
pH: 5 (ref 5.0–8.0)

## 2017-01-11 LAB — MAGNESIUM: Magnesium: 2.2 mg/dL (ref 1.7–2.4)

## 2017-01-11 MED ORDER — SULFAMETHOXAZOLE-TRIMETHOPRIM 800-160 MG PO TABS
ORAL_TABLET | ORAL | Status: AC
  Filled 2017-01-11: qty 1

## 2017-01-11 MED ORDER — SULFAMETHOXAZOLE-TRIMETHOPRIM 800-160 MG PO TABS
1.0000 | ORAL_TABLET | Freq: Once | ORAL | Status: AC
  Administered 2017-01-11: 1 via ORAL

## 2017-01-11 MED ORDER — SULFAMETHOXAZOLE-TRIMETHOPRIM 800-160 MG PO TABS: 1.0000 | ORAL_TABLET | Freq: Two times a day (BID) | ORAL | 0 refills | Status: DC

## 2017-01-11 NOTE — ED Notes (Signed)
Pt ambulatory to waiting room. Pt verbalized understanding of discharge instructions.   

## 2017-01-11 NOTE — Discharge Instructions (Signed)
Call the radiology scheduling office this morning to get an appointment for the ultrasound of your scrotum done. Take the antibiotic. Continue your anti-inflammatory pain medication. Follow up with Dr Diona Fanti about this mass in your scrotum.  Call Dr. Kristian Covey office to get a cardiology evaluation for your skipped beats which are premature atrial contractions. He will advise you to cut back on your caffeine intake. He may consider doing more testing such as a Holter monitor. Return to the emergency department if you feel like your heart is racing continuously more than 20-30 minutes, you have chest pain, shortness of breath, feel lightheaded or dizzy like you going to pass out. Also if you have difficulty urinating or you get a high fever with chills.

## 2017-01-11 NOTE — ED Provider Notes (Signed)
[facility] DEPT Provider Note   CSN: [id number] Arrival date & time: 01/10/17  2317  Time seen 23:55 PM  History   Chief Complaint Chief Complaint  Patient presents with  . Fever    HPI Caleb Reed is a 66 y.o. male.  HPI  patient was here with several different complaints. He states yesterday and today he feels like his heart is "fluttering". He states he'll have a skipped beat and then about 30-40 beats later he'll have another skipped beat. He denies chest pain, shortness of breath, feeling dizzy or lightheaded, or feeling like his heart races. He has had this before. He states he has not discussed this with his primary care doctor or been as I waited by a cardiologist. Looking at his prior ED visits he was seen in October 2012 and May 2015 for similar symptoms.. He states he did have a stress test but it was over 20 years ago. He states he drinks 2-3 cups of coffee every day for breakfast. He drinks wine very occasionally and his wife states maybe the last time was a couple years ago. He denies any family history of coronary artery disease.  Patient also complains of fever for a week. He states he had a "knot" in his groin on the left about 2 weeks ago and he: Incidentally had an appointment with the urologist. He was told it was possibly urine leaking into the scrotum and he was told to take anti-inflammatory pain medication for which she artery takes for his knee. He states one week ago he noted a new not on the right side of the scrotum. He denies any difficulty urinating. He denies any redness or warmth to the skin of the scrotum overlying the "knot". Patient is very upset and states that on his discharge diagnosis by the urologist, that he didn't think he should've put the diagnosis he did. 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  Patient states he has a chronic mild cough and rhinorrhea with clear drainage that he  attributes to allergies. He denies sneezing, sore throat, vomiting, diarrhea, or constipation. He states he does have some mild nausea.  PCP Celene Squibb, MD Urology Dr Diona Fanti  Past Medical History:  Diagnosis Date  . Arthritis   . BPH (benign prostatic hyperplasia)   . Cancer ([place])    skin ca  . Elevated uric acid in blood   . Gallstones   . GERD (gastroesophageal reflux disease)   . PONV (postoperative nausea and vomiting)     Patient Active Problem List   Diagnosis Date Noted  . Personal history of colonic polyps 04/04/2013  . Gallstones 12/21/2012  . Arthritis 12/21/2012  . Diarrhea 12/21/2012    Past Surgical History:  Procedure Laterality Date  . CHOLECYSTECTOMY N/A 02/25/2013   Procedure: LAPAROSCOPIC CHOLECYSTECTOMY;  Surgeon: Jamesetta So, MD;  Location: [facility];  Service: General;  Laterality: N/A;  . COLONOSCOPY N/A 12/29/2012   Procedure: COLONOSCOPY;  Surgeon: Rogene Houston, MD;  Location: [facility];  Service: Endoscopy;  Laterality: N/A;  300-moved to 200 Ann to notify pt  . EYE SURGERY     Shit with an arrow. Can see some out of his eye       Home Medications    Prior to Admission medications   Medication Sig Start Date End Date Taking? Authorizing Provider  cetirizine (ZYRTEC) 10 MG tablet Take 10 mg by mouth daily.  [provider]  dicyclomine (BENTYL) 10 MG capsule Take 1 capsule (10 mg total) by mouth 3 (three) times daily before meals. 04/25/16   Setzer, Rona Ravens, NP  loperamide (IMODIUM) 2 MG capsule Take 2 mg by mouth as needed for diarrhea or loose stools.    [provider]  Multiple Vitamin (MULTIVITAMIN) tablet Take 1 tablet by mouth daily.    [provider]  ranitidine (ZANTAC) 150 MG capsule Take 150 mg by mouth 2 (two) times daily.    [provider]  sulfamethoxazole-trimethoprim (BACTRIM DS,SEPTRA DS) 800-160 MG tablet Take 1 tablet by mouth 2 (two) times daily. 01/11/17   Rolland Porter, MD     Family History Family History  Problem Relation Age of Onset  . Colon cancer Neg Hx     Social History Social History  Substance Use Topics  . Smoking status: Former Research scientist (life sciences)  . Smokeless tobacco: Current User    Types: Chew     Comment: quit years ago  . Alcohol use Yes     Comment: occasionally wine, not often  lives at home Lives with spouse   Allergies   Etodolac and Hydrocodone   Review of Systems Review of Systems  All other systems reviewed and are negative.    Physical Exam Updated Vital Signs BP 122/71   Pulse 81   Temp 99.2 F (37.3 C) (Oral)   Resp 16   Ht 5\' 9"  (1.753 m)   Wt 117.9 kg (260 lb)   SpO2 93%   BMI 38.40 kg/m   Vital signs normal except low grade fever   Physical Exam  Constitutional: He is oriented to person, place, and time. He appears well-developed and well-nourished.  Non-toxic appearance. He does not appear ill. No distress.  HENT:  Head: Normocephalic and atraumatic.  Right Ear: External ear normal.  Left Ear: External ear normal.  Nose: Nose normal. No mucosal edema or rhinorrhea.  Mouth/Throat: Oropharynx is clear and moist and mucous membranes are normal. No dental abscesses or uvula swelling.  Eyes: Pupils are equal, round, and reactive to light. Conjunctivae and EOM are normal.  Neck: Normal range of motion and full passive range of motion without pain. Neck supple.  Cardiovascular: Normal rate and normal heart sounds.  An irregular rhythm present. Exam reveals no gallop and no friction rub.   No murmur heard. Pulmonary/Chest: Effort normal and breath sounds normal. No respiratory distress. He has no wheezes. He has no rhonchi. He has no rales. He exhibits no tenderness and no crepitus.  Abdominal: Soft. Normal appearance and bowel sounds are normal. He exhibits no distension. There is no tenderness. There is no rebound and no guarding.  Genitourinary:  Genitourinary Comments: Patient is nontender to palpation over  the epididymis bilaterally. His left testicle is normal size and without masses or tenderness. I do not feel any extra "knots". In examining his right testicle there is a firm mass that is superior and posterior to the right testicle that extends posteriorly. It is approximately 2 x 3 cm in size.  Musculoskeletal: Normal range of motion. He exhibits no edema or tenderness.  Moves all extremities well.   Neurological: He is alert and oriented to person, place, and time. He has normal strength. No cranial nerve deficit.  Skin: Skin is warm, dry and intact. No rash noted. No erythema. No pallor.  Psychiatric: He has a normal mood and affect. His speech is normal and behavior is normal. His mood appears not anxious.  Nursing note and vitals reviewed.    ED Treatments / Results  Labs (all labs ordered are listed, but only abnormal results are displayed) Results for orders placed or performed during the hospital encounter of 01/10/17  Comprehensive metabolic panel  Result Value Ref Range   Sodium 138 135 - 145 mmol/L   Potassium 3.6 3.5 - 5.1 mmol/L   Chloride 102 101 - 111 mmol/L   CO2 28 22 - 32 mmol/L   Glucose, Bld 131 (H) 65 - 99 mg/dL   BUN 12 6 - 20 mg/dL   Creatinine, Ser 0.86 0.61 - 1.24 mg/dL   Calcium 8.7 (L) 8.9 - 10.3 mg/dL   Total Protein 7.2 6.5 - 8.1 g/dL   Albumin 3.4 (L) 3.5 - 5.0 g/dL   AST 18 15 - 41 U/L   ALT 20 17 - 63 U/L   Alkaline Phosphatase 100 38 - 126 U/L   Total Bilirubin 0.4 0.3 - 1.2 mg/dL   GFR calc non Af Amer >60 >60 mL/min   GFR calc Af Amer >60 >60 mL/min   Anion gap 8 5 - 15  CBC with Differential  Result Value Ref Range   WBC 10.1 4.0 - 10.5 K/uL   RBC 4.18 (L) 4.22 - 5.81 MIL/uL   Hemoglobin 12.3 (L) 13.0 - 17.0 g/dL   HCT 38.1 (L) 39.0 - 52.0 %   MCV 91.1 78.0 - 100.0 fL   MCH 29.4 26.0 - 34.0 pg   MCHC 32.3 30.0 - 36.0 g/dL   RDW 12.6 11.5 - 15.5 %   Platelets 326 150 - 400 K/uL   Neutrophils Relative % 71 %   Neutro Abs 7.1 1.7 - 7.7  K/uL   Lymphocytes Relative 16 %   Lymphs Abs 1.6 0.7 - 4.0 K/uL   Monocytes Relative 10 %   Monocytes Absolute 1.0 0.1 - 1.0 K/uL   Eosinophils Relative 3 %   Eosinophils Absolute 0.3 0.0 - 0.7 K/uL   Basophils Relative 0 %   Basophils Absolute 0.0 0.0 - 0.1 K/uL  Magnesium  Result Value Ref Range   Magnesium 2.2 1.7 - 2.4 mg/dL  Urinalysis, Routine w reflex microscopic  Result Value Ref Range   Color, Urine YELLOW YELLOW   APPearance CLEAR CLEAR   Specific Gravity, Urine 1.011 1.005 - 1.030   pH 5.0 5.0 - 8.0   Glucose, UA NEGATIVE NEGATIVE mg/dL   Hgb urine dipstick NEGATIVE NEGATIVE   Bilirubin Urine NEGATIVE NEGATIVE   Ketones, ur NEGATIVE NEGATIVE mg/dL   Protein, ur NEGATIVE NEGATIVE mg/dL   Nitrite NEGATIVE NEGATIVE   Leukocytes, UA NEGATIVE NEGATIVE   Laboratory interpretation all normal     EKG  EKG Interpretation  Date/Time:  Saturday January 10 2017 23:34:10 EDT Ventricular Rate:  81 PR Interval:    QRS Duration: 84 QT Interval:  360 QTC Calculation: 418 R Axis:   49 Text Interpretation:  Sinus rhythm Atrial premature complex Since last tracing 02 Sep 2013 T wave inversion no longer evident in Anterior leads Confirmed by Rolland Porter [phone]) on 01/10/2017 11:37:57 PM       Radiology No results found.  Procedures Procedures (including critical care time)  Medications Ordered in ED Medications  sulfamethoxazole-trimethoprim (BACTRIM DS,SEPTRA DS) 800-160 MG per tablet 1 tablet (not administered)     Initial Impression / Assessment and Plan / ED Course  I have reviewed the triage vital signs and the nursing notes.  Pertinent labs & imaging results that were  available during my care of the patient were reviewed by me and considered in my medical decision making (see chart for details).    After examining patient we discussed he needed a ultrasound done of his groin however ultrasound is not available at night. I do not suspect acute testicular  torsion, however there is some type of mass associated with his right testicle. Laboratory testing was done to evaluate his symptoms of palpitations to look for electrolyte abnormalities. We discussed he should decrease his caffeine intake. Urinalysis was done.  Patient's laboratory testing was all normal. He was discharged home and given referral to a cardiologist and referral back to his urologist. He is to call in the morning to get an outpatient ultrasound of his scrotum done. He should return if he gets high fever, has difficulty urinating, or if he gets sustained palpitations, chest pain, shortness of breath, or feeling he is going to pass out.  He was started on antibiotics, Septra DS in the ED and given a prescription.   Final Clinical Impressions(s) / ED Diagnoses   Final diagnoses:  Palpitations  PAC (premature atrial contraction)  Mass of scrotum    New Prescriptions New Prescriptions   SULFAMETHOXAZOLE-TRIMETHOPRIM (BACTRIM DS,SEPTRA DS) 800-160 MG TABLET    Take 1 tablet by mouth 2 (two) times daily.    Plan discharge  Rolland Porter, MD, Barbette Or, MD 01/11/17 [place]

## 2017-01-12 ENCOUNTER — Other Ambulatory Visit (HOSPITAL_COMMUNITY): Payer: Self-pay | Admitting: Emergency Medicine

## 2017-01-12 ENCOUNTER — Ambulatory Visit (HOSPITAL_COMMUNITY)
Admission: RE | Admit: 2017-01-12 | Discharge: 2017-01-12 | Disposition: A | Discharge status: Home / Self Care | Payer: Medicare Other | Source: Ambulatory Visit | Attending: Emergency Medicine | Admitting: Emergency Medicine

## 2017-01-12 DIAGNOSIS — N433 Hydrocele, unspecified: Secondary | ICD-10-CM | POA: Insufficient documentation

## 2017-01-12 DIAGNOSIS — N5089 Other specified disorders of the male genital organs: Secondary | ICD-10-CM

## 2017-01-12 DIAGNOSIS — I861 Scrotal varices: Secondary | ICD-10-CM | POA: Diagnosis not present

## 2017-01-12 DIAGNOSIS — N509 Disorder of male genital organs, unspecified: Secondary | ICD-10-CM | POA: Insufficient documentation

## 2017-01-15 DIAGNOSIS — Z712 Person consulting for explanation of examination or test findings: Secondary | ICD-10-CM | POA: Diagnosis not present

## 2017-01-15 DIAGNOSIS — R509 Fever, unspecified: Secondary | ICD-10-CM | POA: Diagnosis not present

## 2017-01-15 DIAGNOSIS — N433 Hydrocele, unspecified: Secondary | ICD-10-CM | POA: Diagnosis not present

## 2017-01-15 DIAGNOSIS — R51 Headache: Secondary | ICD-10-CM | POA: Diagnosis not present

## 2017-01-15 DIAGNOSIS — R002 Palpitations: Secondary | ICD-10-CM | POA: Diagnosis not present

## 2017-01-20 ENCOUNTER — Ambulatory Visit (INDEPENDENT_AMBULATORY_CARE_PROVIDER_SITE_OTHER): Payer: Medicare Other | Admitting: Urology

## 2017-01-20 DIAGNOSIS — E230 Hypopituitarism: Secondary | ICD-10-CM | POA: Diagnosis not present

## 2017-01-20 DIAGNOSIS — N453 Epididymo-orchitis: Secondary | ICD-10-CM | POA: Diagnosis not present

## 2017-01-28 DIAGNOSIS — M1712 Unilateral primary osteoarthritis, left knee: Secondary | ICD-10-CM | POA: Diagnosis not present

## 2017-01-28 DIAGNOSIS — R002 Palpitations: Secondary | ICD-10-CM | POA: Diagnosis not present

## 2017-01-28 DIAGNOSIS — R5383 Other fatigue: Secondary | ICD-10-CM | POA: Diagnosis not present

## 2017-01-28 DIAGNOSIS — R110 Nausea: Secondary | ICD-10-CM | POA: Diagnosis not present

## 2017-01-28 DIAGNOSIS — N433 Hydrocele, unspecified: Secondary | ICD-10-CM | POA: Diagnosis not present

## 2017-01-28 DIAGNOSIS — Z6835 Body mass index (BMI) 35.0-35.9, adult: Secondary | ICD-10-CM | POA: Diagnosis not present

## 2017-02-02 ENCOUNTER — Encounter: Payer: Self-pay | Admitting: Cardiovascular Disease

## 2017-02-02 ENCOUNTER — Ambulatory Visit (INDEPENDENT_AMBULATORY_CARE_PROVIDER_SITE_OTHER): Payer: Medicare Other | Admitting: Cardiovascular Disease

## 2017-02-02 VITALS — BP 124/64 | HR 79 | Ht 69.0 in | Wt 250.0 lb

## 2017-02-02 DIAGNOSIS — R002 Palpitations: Secondary | ICD-10-CM | POA: Diagnosis not present

## 2017-02-02 DIAGNOSIS — R5383 Other fatigue: Secondary | ICD-10-CM | POA: Diagnosis not present

## 2017-02-02 DIAGNOSIS — R531 Weakness: Secondary | ICD-10-CM

## 2017-02-02 NOTE — Patient Instructions (Signed)
Your physician recommends that you schedule a follow-up appointment in: as needed     Your physician recommends that you continue on your current medications as directed. Please refer to the Current Medication list given to you today.   Thank you for choosing  [facility] !         

## 2017-02-02 NOTE — Progress Notes (Signed)
CARDIOLOGY CONSULT NOTE  Patient ID: DONNIE Reed MRN: [id number] DOB/AGE: 01/15/1951 66 y.o.  Admit date: (Not on file) Primary Physician: Celene Squibb, MD Referring Physician: Dr. Nevada Crane  Reason for Consultation: Palpitations  HPI: Caleb Reed is a 66 y.o. male who is being seen today for the evaluation of palpitations at the request of Celene Squibb, MD.   He was evaluated in the ED on 01/11/17. He had no associated chest pain, shortness of breath, dizziness, or lightheadedness. He drinks 2-3 cups of coffee daily for breakfast. When he was evaluated in the ED, he also had a fever. He was started on antibiotics. He may have had a presumed GU infection. Urinalysis was normal.  I personally reviewed all relevant dictation, labs, studies. Potassium was normal at 3.6. Additional labs included the following: BUN 12, creatinine 0.86, sodium 138, hemoglobin 12.3, white blood cells 10.1, platelet 326, magnesium 2.2.  I personally reviewed the ECG performed on 01/10/17 which demonstrated sinus rhythm with an isolated PAC. There were no ischemic ST segment or T-wave abnormalities, nor any sustained arrhythmias.  Since August 10, the patient has not felt like himself. He has woken up with night sweats. His wife reports temperatures are 101.2 at times. She said his blood pressure has been episodically elevated at home and he carries no diagnosis of hypertension.   He continues to deny chest pain, orthopnea, leg swelling, and paroxysmal nocturnal dyspnea. He has palpitations about 3 times a year. He has chronic exertional dyspnea which he treats to being out of shape. He has felt more weak and tired since August 10.   His PCP is doing an extensive workup. He has some anemia and he is being evaluated by GI in the near future.   He denies hematochezia melena. He denies unintentional weight loss.   Allergies  Allergen Reactions  . Etodolac Hives    Hives all over  . Hydrocodone  Itching    nausea    Current Outpatient Prescriptions  Medication Sig Dispense Refill  . cetirizine (ZYRTEC) 10 MG tablet Take 10 mg by mouth daily.    . Iron-FA-B Cmp-C-Biot-Probiotic (FUSION PLUS PO) Take by mouth.    . loperamide (IMODIUM) 2 MG capsule Take 2 mg by mouth as needed for diarrhea or loose stools.    Marland Kitchen omeprazole (PRILOSEC) 20 MG capsule 20 mg.    . ranitidine (ZANTAC) 150 MG capsule Take 150 mg by mouth 2 (two) times daily.    . traMADol (ULTRAM) 50 MG tablet Take 50 mg by mouth every 6 (six) hours as needed.     No current facility-administered medications for this visit.     Past Medical History:  Diagnosis Date  . Arthritis   . BPH (benign prostatic hyperplasia)   . Cancer ([place])    skin ca  . Elevated uric acid in blood   . Gallstones   . GERD (gastroesophageal reflux disease)   . PONV (postoperative nausea and vomiting)     Past Surgical History:  Procedure Laterality Date  . CHOLECYSTECTOMY N/A 02/25/2013   Procedure: LAPAROSCOPIC CHOLECYSTECTOMY;  Surgeon: Jamesetta So, MD;  Location: [facility];  Service: General;  Laterality: N/A;  . COLONOSCOPY N/A 12/29/2012   Procedure: COLONOSCOPY;  Surgeon: Rogene Houston, MD;  Location: [facility];  Service: Endoscopy;  Laterality: N/A;  300-moved to 200 Ann to notify pt  . EYE SURGERY     Shit with an arrow. Can  see some out of his eye    Social History   Social History  . Marital status: Married    Spouse name: N/A  . Number of children: N/A  . Years of education: N/A   Occupational History  . Not on file.   Social History Main Topics  . Smoking status: Former Research scientist (life sciences)  . Smokeless tobacco: Current User    Types: Chew     Comment: quit years ago  . Alcohol use Yes     Comment: occasionally wine, not often  . Drug use: No  . Sexual activity: Yes    Birth control/ protection: Sponge   Other Topics Concern  . Not on file   Social History Narrative  . No narrative on file     No family  history of premature CAD in 1st degree relatives.  Current Meds  Medication Sig  . cetirizine (ZYRTEC) 10 MG tablet Take 10 mg by mouth daily.  . Iron-FA-B Cmp-C-Biot-Probiotic (FUSION PLUS PO) Take by mouth.  . loperamide (IMODIUM) 2 MG capsule Take 2 mg by mouth as needed for diarrhea or loose stools.  Marland Kitchen omeprazole (PRILOSEC) 20 MG capsule 20 mg.  . ranitidine (ZANTAC) 150 MG capsule Take 150 mg by mouth 2 (two) times daily.  . traMADol (ULTRAM) 50 MG tablet Take 50 mg by mouth every 6 (six) hours as needed.      Review of systems complete and found to be negative unless listed above in HPI    Physical exam Blood pressure 124/64, pulse 79, height 5\' 9"  (1.753 m), weight 250 lb (113.4 kg), SpO2 94 %. General: NAD Neck: No JVD, no thyromegaly or thyroid nodule.  Lungs: Clear to auscultation bilaterally with normal respiratory effort. CV: Nondisplaced PMI. Regular rate and rhythm, normal S1/S2, no S3/S4, no murmur.  No peripheral edema.  No carotid bruit.    Abdomen: Soft, nontender, no distention.  Skin: Intact without lesions or rashes.  Neurologic: Alert and oriented x 3.  Psych: Normal affect. Extremities: No clubbing or cyanosis.  HEENT: Normal.   ECG: Most recent ECG reviewed.   Labs: Lab Results  Component Value Date/Time   K 3.6 01/10/2017 11:32 PM   BUN 12 01/10/2017 11:32 PM   CREATININE 0.86 01/10/2017 11:32 PM   ALT 20 01/10/2017 11:32 PM   TSH 3.982 01/30/2011 11:00 PM   HGB 12.3 (L) 01/10/2017 11:32 PM     Lipids: No results found for: LDLCALC, LDLDIRECT, CHOL, TRIG, HDL      ASSESSMENT AND PLAN:  1. Palpitations: This may be due to PACs. Electrolytes were unremarkable.There does not appear to be a history of sustained palpitations. I do not feel noninvasive cardiac imaging for cardiac monitoring is warranted at this time.  There appears to be some underlying illness which is causing his symptoms. This is being evaluated by his PCP. I told him should he  experience exertional chest tightness or should exertional dyspnea become progressive without a pulmonary etiology and anemia is ruled out as a cause, I would be happy to see him again.  2. Weakness and fatigue:  There appears to be some underlying illness which is causing his symptoms. This is being evaluated by his PCP.   He does have low testosterone and I reviewed these labs. He is mildly anemic and stool occults are being checked.  I told him should he experience exertional chest tightness or should exertional dyspnea become progressive without a pulmonary etiology and anemia is ruled out as  a cause, I would be happy to see him again.    Disposition: Follow up prn.   Signed: Kate Sable, M.D., F.A.C.C.  02/02/2017, 8:44 AM

## 2017-02-03 ENCOUNTER — Ambulatory Visit (INDEPENDENT_AMBULATORY_CARE_PROVIDER_SITE_OTHER): Payer: Medicare Other | Admitting: Urology

## 2017-02-03 DIAGNOSIS — N453 Epididymo-orchitis: Secondary | ICD-10-CM | POA: Diagnosis not present

## 2017-02-03 DIAGNOSIS — N430 Encysted hydrocele: Secondary | ICD-10-CM | POA: Diagnosis not present

## 2017-02-03 DIAGNOSIS — E291 Testicular hypofunction: Secondary | ICD-10-CM

## 2017-02-04 ENCOUNTER — Encounter (INDEPENDENT_AMBULATORY_CARE_PROVIDER_SITE_OTHER): Payer: Self-pay | Admitting: Internal Medicine

## 2017-02-04 ENCOUNTER — Ambulatory Visit (INDEPENDENT_AMBULATORY_CARE_PROVIDER_SITE_OTHER): Payer: Medicare Other | Admitting: Internal Medicine

## 2017-02-04 ENCOUNTER — Encounter (INDEPENDENT_AMBULATORY_CARE_PROVIDER_SITE_OTHER): Payer: Self-pay | Admitting: *Deleted

## 2017-02-04 VITALS — BP 122/78 | HR 68 | Temp 98.1°F | Ht 69.0 in | Wt 248.5 lb

## 2017-02-04 DIAGNOSIS — R110 Nausea: Secondary | ICD-10-CM

## 2017-02-04 NOTE — Progress Notes (Signed)
Subjective:    Patient ID: Caleb Reed, male    DOB: Jan 16, 1951, 66 y.o.   MRN: [id number]  HPI Presents today with c/o chronic nausea. Last seen in December of 2017 with nausea.  Has had nausea since his GB was removed in 2014.  He states he has chronic nausea. He wife states he was sick all day yesterday. He says he has no energy. He has nausea. There has been no weight loss. If he stand up for a long time, he will become nauseated. At other times, the nausea is random.  He denies any acid reflux. Santina Evans has heartburn). Has a BM x 1 a day.  When he smells foods during the day, he does not have nausea. At night, he will have nausea if he smells food.   12/29/2012: Procedure: Colonoscopy  Indications:Patient is 66 year old Caucasian male who is here for screening colonoscopy. He does give seven months history of diarrhea. Stool studies were negative. Diarrhea started when he took antibiotic for dental infection and has continued. Family history is negative for CRC.   Impression:  Examination performed to cecum. Three small polyps ablated while cold biopsy from transverse colon and submitted together. Mild sigmoid colon diverticulosis. No evidence of endoscopic colitis. Random biopsies taken from mucosa of sigmoid colon looking for microscopic or collagenous colitis.  All 3 polyps are tubular adenomas. These are small polyps. Biopsy from mucosa of sigmoid colon negative for colitis. Patient will continue Imodium 1-2 mg a day fiber supplement and probiotic and return for office visit in 8 weeks. He can take OTC Zantac twice a day  12/31/2016 bili 0.3, ALP 118, AST 21, ALT 20.   CBC    Component Value Date/Time   WBC 10.1 01/10/2017 2332   RBC 4.18 (L) 01/10/2017 2332   HGB 12.3 (L) 01/10/2017 2332   HCT 38.1 (L) 01/10/2017 2332   PLT 326 01/10/2017 2332   MCV 91.1 01/10/2017 2332   MCH 29.4 01/10/2017 2332   MCHC 32.3 01/10/2017 2332   RDW 12.6 01/10/2017  2332   LYMPHSABS 1.6 01/10/2017 2332   MONOABS 1.0 01/10/2017 2332   EOSABS 0.3 01/10/2017 2332   BASOSABS 0.0 01/10/2017 2332   Lipase     Component Value Date/Time   LIPASE 15 02/24/2013 3614     Review of Systems Past Medical History:  Diagnosis Date  . Arthritis   . BPH (benign prostatic hyperplasia)   . Cancer ([place])    skin ca  . Elevated uric acid in blood   . Gallstones   . GERD (gastroesophageal reflux disease)   . PONV (postoperative nausea and vomiting)     Past Surgical History:  Procedure Laterality Date  . CHOLECYSTECTOMY N/A 02/25/2013   Procedure: LAPAROSCOPIC CHOLECYSTECTOMY;  Surgeon: Jamesetta So, MD;  Location: [facility];  Service: General;  Laterality: N/A;  . COLONOSCOPY N/A 12/29/2012   Procedure: COLONOSCOPY;  Surgeon: Rogene Houston, MD;  Location: [facility];  Service: Endoscopy;  Laterality: N/A;  300-moved to 200 Ann to notify pt  . EYE SURGERY     Shit with an arrow. Can see some out of his eye    Allergies  Allergen Reactions  . Etodolac Hives    Hives all over  . Hydrocodone Itching    nausea    Current Outpatient Prescriptions on File Prior to Visit  Medication Sig Dispense Refill  . omeprazole (PRILOSEC) 20 MG capsule 20 mg.    . traMADol (ULTRAM) 50  MG tablet Take 50 mg by mouth every 6 (six) hours as needed.    . cetirizine (ZYRTEC) 10 MG tablet Take 10 mg by mouth daily.    . Iron-FA-B Cmp-C-Biot-Probiotic (FUSION PLUS PO) Take by mouth.    . loperamide (IMODIUM) 2 MG capsule Take 2 mg by mouth as needed for diarrhea or loose stools.    . ranitidine (ZANTAC) 150 MG capsule Take 150 mg by mouth 2 (two) times daily.     No current facility-administered medications on file prior to visit.         Objective:   Physical Exam Blood pressure 122/78, pulse 68, temperature 98.1 F (36.7 C), height 5\' 9"  (1.753 m), weight 248 lb 8.6 oz (112.7 kg).  Alert and oriented. Skin warm and dry. Oral mucosa is moist.   . Sclera  anicteric, conjunctivae is pink. Thyroid not enlarged. No cervical lymphadenopathy. Lungs clear. Heart regular rate and rhythm.  Abdomen is soft. Bowel sounds are positive. No hepatomegaly. No abdominal masses felt. No tenderness.  No edema to lower extremities.         Assessment & Plan:  Chronic nausea.  NM Emptying study.  Further recommendations to follow. May end up with an EGD.

## 2017-02-04 NOTE — Patient Instructions (Signed)
NM emptying study.

## 2017-02-10 DIAGNOSIS — M1712 Unilateral primary osteoarthritis, left knee: Secondary | ICD-10-CM | POA: Diagnosis not present

## 2017-02-11 ENCOUNTER — Encounter (HOSPITAL_COMMUNITY): Payer: Self-pay

## 2017-02-11 ENCOUNTER — Ambulatory Visit (HOSPITAL_COMMUNITY)
Admission: RE | Admit: 2017-02-11 | Discharge: 2017-02-11 | Disposition: A | Discharge status: Home / Self Care | Payer: Medicare Other | Source: Ambulatory Visit | Attending: Internal Medicine | Admitting: Internal Medicine

## 2017-02-11 DIAGNOSIS — R1110 Vomiting, unspecified: Secondary | ICD-10-CM | POA: Diagnosis not present

## 2017-02-11 DIAGNOSIS — R110 Nausea: Secondary | ICD-10-CM | POA: Diagnosis not present

## 2017-02-11 MED ORDER — TECHNETIUM TC 99M SULFUR COLLOID
2.0000 | Freq: Once | INTRAVENOUS | Status: AC | PRN
  Administered 2017-02-11: 2.1 via ORAL

## 2017-02-12 DIAGNOSIS — R002 Palpitations: Secondary | ICD-10-CM | POA: Diagnosis not present

## 2017-02-12 DIAGNOSIS — R5382 Chronic fatigue, unspecified: Secondary | ICD-10-CM | POA: Diagnosis not present

## 2017-02-12 DIAGNOSIS — M1712 Unilateral primary osteoarthritis, left knee: Secondary | ICD-10-CM | POA: Diagnosis not present

## 2017-02-12 DIAGNOSIS — D649 Anemia, unspecified: Secondary | ICD-10-CM | POA: Diagnosis not present

## 2017-02-12 DIAGNOSIS — M25562 Pain in left knee: Secondary | ICD-10-CM | POA: Diagnosis not present

## 2017-02-12 DIAGNOSIS — R509 Fever, unspecified: Secondary | ICD-10-CM | POA: Diagnosis not present

## 2017-02-27 ENCOUNTER — Encounter (INDEPENDENT_AMBULATORY_CARE_PROVIDER_SITE_OTHER): Payer: Self-pay | Admitting: Internal Medicine

## 2017-02-27 NOTE — Progress Notes (Signed)
Patient was given an appointment for 05/28/17 at 10:45am.  A letter was mailed to the patient.

## 2017-04-01 DIAGNOSIS — M1712 Unilateral primary osteoarthritis, left knee: Secondary | ICD-10-CM | POA: Diagnosis not present

## 2017-04-03 DIAGNOSIS — E291 Testicular hypofunction: Secondary | ICD-10-CM | POA: Diagnosis not present

## 2017-04-03 DIAGNOSIS — R002 Palpitations: Secondary | ICD-10-CM | POA: Diagnosis not present

## 2017-04-03 DIAGNOSIS — I10 Essential (primary) hypertension: Secondary | ICD-10-CM | POA: Diagnosis not present

## 2017-04-03 DIAGNOSIS — R7301 Impaired fasting glucose: Secondary | ICD-10-CM | POA: Diagnosis not present

## 2017-04-03 DIAGNOSIS — Z6834 Body mass index (BMI) 34.0-34.9, adult: Secondary | ICD-10-CM | POA: Diagnosis not present

## 2017-04-03 DIAGNOSIS — M179 Osteoarthritis of knee, unspecified: Secondary | ICD-10-CM | POA: Diagnosis not present

## 2017-04-03 DIAGNOSIS — N400 Enlarged prostate without lower urinary tract symptoms: Secondary | ICD-10-CM | POA: Diagnosis not present

## 2017-05-28 ENCOUNTER — Ambulatory Visit (INDEPENDENT_AMBULATORY_CARE_PROVIDER_SITE_OTHER): Payer: 59 | Admitting: Internal Medicine

## 2017-06-01 DIAGNOSIS — E291 Testicular hypofunction: Secondary | ICD-10-CM | POA: Diagnosis not present

## 2017-06-09 ENCOUNTER — Ambulatory Visit: Payer: 59 | Admitting: Urology

## 2017-07-07 ENCOUNTER — Ambulatory Visit (INDEPENDENT_AMBULATORY_CARE_PROVIDER_SITE_OTHER): Payer: 59 | Admitting: Urology

## 2017-07-07 DIAGNOSIS — N430 Encysted hydrocele: Secondary | ICD-10-CM | POA: Diagnosis not present

## 2017-07-07 DIAGNOSIS — E291 Testicular hypofunction: Secondary | ICD-10-CM | POA: Diagnosis not present

## 2017-07-07 DIAGNOSIS — N486 Induration penis plastica: Secondary | ICD-10-CM

## 2017-07-14 ENCOUNTER — Ambulatory Visit (INDEPENDENT_AMBULATORY_CARE_PROVIDER_SITE_OTHER): Payer: 59 | Admitting: Urology

## 2017-07-14 DIAGNOSIS — N430 Encysted hydrocele: Secondary | ICD-10-CM

## 2017-07-15 DIAGNOSIS — Z08 Encounter for follow-up examination after completed treatment for malignant neoplasm: Secondary | ICD-10-CM | POA: Diagnosis not present

## 2017-07-15 DIAGNOSIS — Z1283 Encounter for screening for malignant neoplasm of skin: Secondary | ICD-10-CM | POA: Diagnosis not present

## 2017-07-15 DIAGNOSIS — L820 Inflamed seborrheic keratosis: Secondary | ICD-10-CM | POA: Diagnosis not present

## 2017-07-15 DIAGNOSIS — Z85820 Personal history of malignant melanoma of skin: Secondary | ICD-10-CM | POA: Diagnosis not present

## 2017-07-15 DIAGNOSIS — D0439 Carcinoma in situ of skin of other parts of face: Secondary | ICD-10-CM | POA: Diagnosis not present

## 2017-09-29 DIAGNOSIS — D649 Anemia, unspecified: Secondary | ICD-10-CM | POA: Diagnosis not present

## 2017-09-29 DIAGNOSIS — E119 Type 2 diabetes mellitus without complications: Secondary | ICD-10-CM | POA: Diagnosis not present

## 2017-09-29 DIAGNOSIS — N400 Enlarged prostate without lower urinary tract symptoms: Secondary | ICD-10-CM | POA: Diagnosis not present

## 2017-09-29 DIAGNOSIS — E785 Hyperlipidemia, unspecified: Secondary | ICD-10-CM | POA: Diagnosis not present

## 2017-10-02 DIAGNOSIS — M179 Osteoarthritis of knee, unspecified: Secondary | ICD-10-CM | POA: Diagnosis not present

## 2017-10-02 DIAGNOSIS — Z6834 Body mass index (BMI) 34.0-34.9, adult: Secondary | ICD-10-CM | POA: Diagnosis not present

## 2017-10-02 DIAGNOSIS — Z0000 Encounter for general adult medical examination without abnormal findings: Secondary | ICD-10-CM | POA: Diagnosis not present

## 2017-10-02 DIAGNOSIS — R7301 Impaired fasting glucose: Secondary | ICD-10-CM | POA: Diagnosis not present

## 2017-10-02 DIAGNOSIS — N400 Enlarged prostate without lower urinary tract symptoms: Secondary | ICD-10-CM | POA: Diagnosis not present

## 2017-10-02 DIAGNOSIS — E291 Testicular hypofunction: Secondary | ICD-10-CM | POA: Diagnosis not present

## 2017-10-02 DIAGNOSIS — R002 Palpitations: Secondary | ICD-10-CM | POA: Diagnosis not present

## 2017-10-02 DIAGNOSIS — N433 Hydrocele, unspecified: Secondary | ICD-10-CM | POA: Diagnosis not present

## 2017-10-12 DIAGNOSIS — E291 Testicular hypofunction: Secondary | ICD-10-CM | POA: Diagnosis not present

## 2017-10-20 ENCOUNTER — Ambulatory Visit (INDEPENDENT_AMBULATORY_CARE_PROVIDER_SITE_OTHER): Payer: Medicare Other | Admitting: Urology

## 2017-10-20 DIAGNOSIS — N486 Induration penis plastica: Secondary | ICD-10-CM

## 2017-10-20 DIAGNOSIS — E291 Testicular hypofunction: Secondary | ICD-10-CM | POA: Diagnosis not present

## 2017-10-20 DIAGNOSIS — N430 Encysted hydrocele: Secondary | ICD-10-CM | POA: Diagnosis not present

## 2017-12-02 ENCOUNTER — Ambulatory Visit (INDEPENDENT_AMBULATORY_CARE_PROVIDER_SITE_OTHER): Payer: Medicare Other | Admitting: Urology

## 2017-12-02 DIAGNOSIS — N5201 Erectile dysfunction due to arterial insufficiency: Secondary | ICD-10-CM

## 2017-12-02 DIAGNOSIS — N486 Induration penis plastica: Secondary | ICD-10-CM | POA: Diagnosis not present

## 2017-12-15 DIAGNOSIS — H17822 Peripheral opacity of cornea, left eye: Secondary | ICD-10-CM | POA: Diagnosis not present

## 2017-12-15 DIAGNOSIS — H25813 Combined forms of age-related cataract, bilateral: Secondary | ICD-10-CM | POA: Diagnosis not present

## 2018-01-01 ENCOUNTER — Encounter (INDEPENDENT_AMBULATORY_CARE_PROVIDER_SITE_OTHER): Payer: Self-pay | Admitting: *Deleted

## 2018-02-18 DIAGNOSIS — D1801 Hemangioma of skin and subcutaneous tissue: Secondary | ICD-10-CM | POA: Diagnosis not present

## 2018-02-18 DIAGNOSIS — D225 Melanocytic nevi of trunk: Secondary | ICD-10-CM | POA: Diagnosis not present

## 2018-02-18 DIAGNOSIS — Z85820 Personal history of malignant melanoma of skin: Secondary | ICD-10-CM | POA: Diagnosis not present

## 2018-02-18 DIAGNOSIS — D2261 Melanocytic nevi of right upper limb, including shoulder: Secondary | ICD-10-CM | POA: Diagnosis not present

## 2018-02-18 DIAGNOSIS — D485 Neoplasm of uncertain behavior of skin: Secondary | ICD-10-CM | POA: Diagnosis not present

## 2018-02-18 DIAGNOSIS — Z08 Encounter for follow-up examination after completed treatment for malignant neoplasm: Secondary | ICD-10-CM | POA: Diagnosis not present

## 2018-02-18 DIAGNOSIS — Z85828 Personal history of other malignant neoplasm of skin: Secondary | ICD-10-CM | POA: Diagnosis not present

## 2018-02-18 DIAGNOSIS — Z1283 Encounter for screening for malignant neoplasm of skin: Secondary | ICD-10-CM | POA: Diagnosis not present

## 2018-02-26 ENCOUNTER — Other Ambulatory Visit (INDEPENDENT_AMBULATORY_CARE_PROVIDER_SITE_OTHER): Payer: Self-pay | Admitting: *Deleted

## 2018-02-26 DIAGNOSIS — Z86010 Personal history of colonic polyps: Secondary | ICD-10-CM | POA: Insufficient documentation

## 2018-03-08 DIAGNOSIS — J309 Allergic rhinitis, unspecified: Secondary | ICD-10-CM | POA: Diagnosis not present

## 2018-03-08 DIAGNOSIS — R002 Palpitations: Secondary | ICD-10-CM | POA: Diagnosis not present

## 2018-03-17 ENCOUNTER — Other Ambulatory Visit: Payer: Self-pay

## 2018-03-19 IMAGING — US US SCROTUM W/ DOPPLER COMPLETE
1 series · 14 of 25 positions shown · non-contrast
Comparison: None.

CLINICAL DATA: Right scrotal mass for 2 weeks.

EXAM:
ULTRASOUND OF SCROTUM
TECHNIQUE: Complete ultrasound examination of the testicles, epididymis, and
other scrotal structures was performed.

[Series 1: us scrotum w/ doppler complete · 0.09mm/px · 80 acquisitions, 14 frames shown]
[im 1/80]
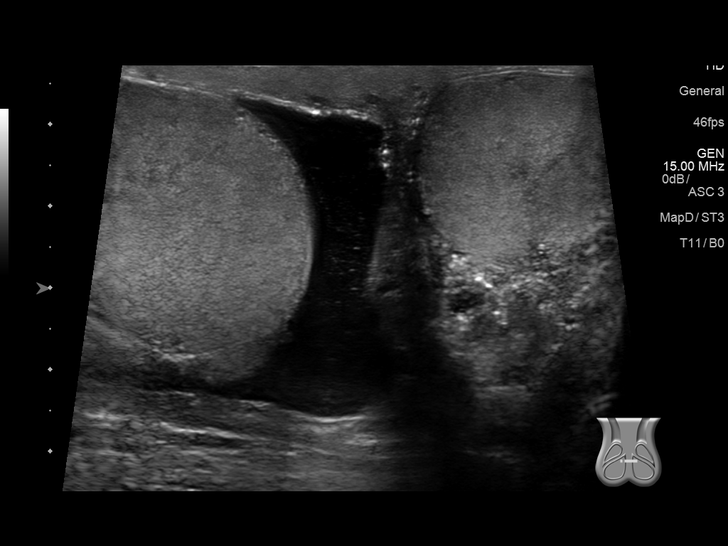
[im 7/80]
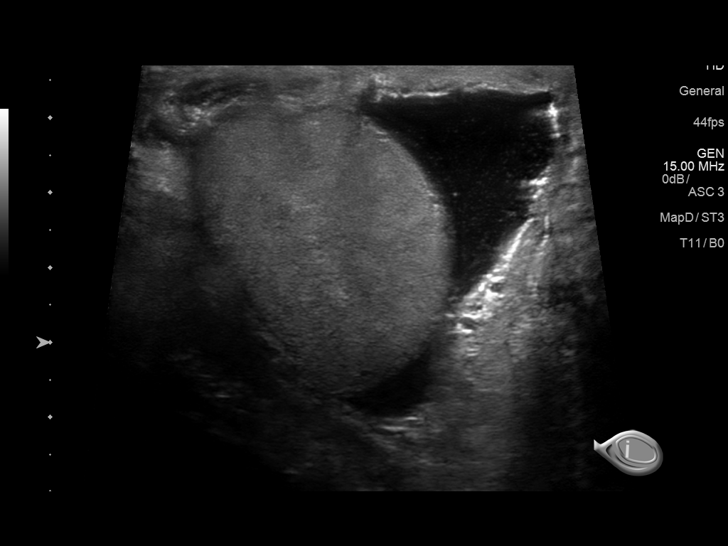
[im 14/80]
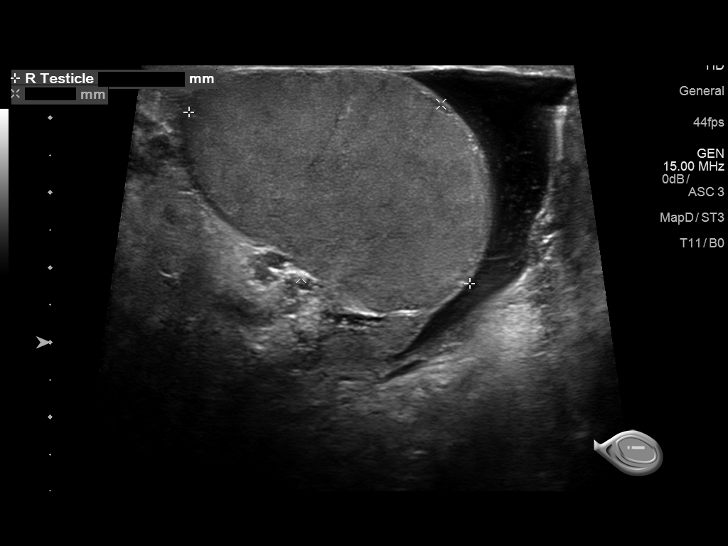
[im 20/80]
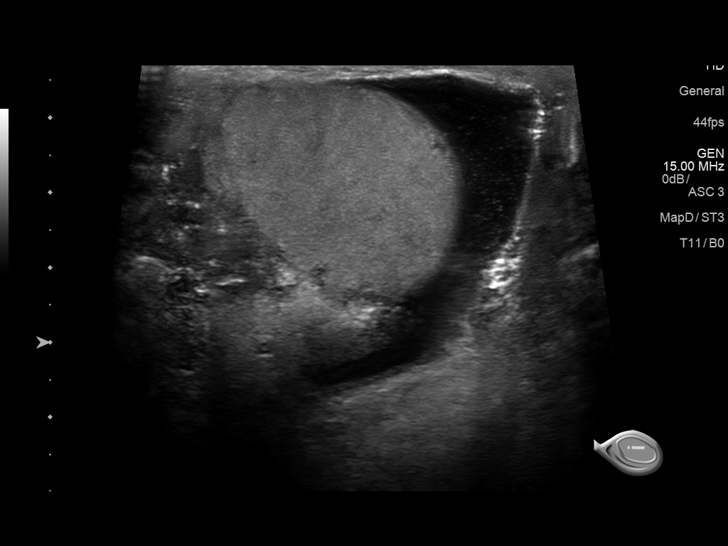
[im 27/80]
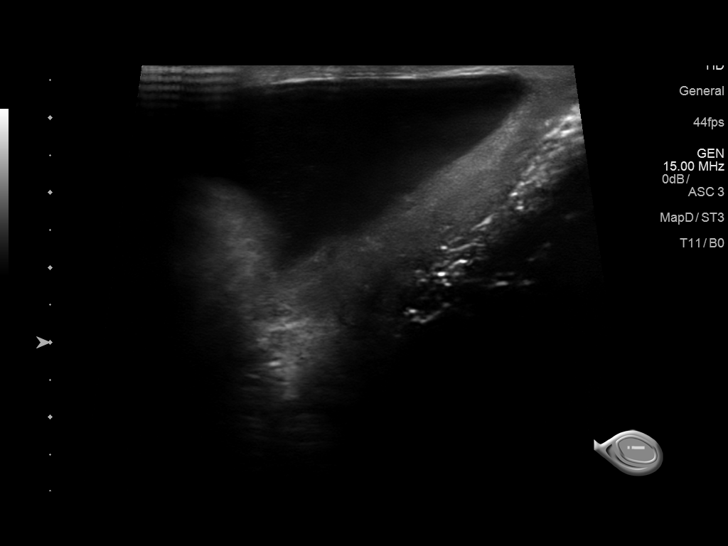
[im 30/80]
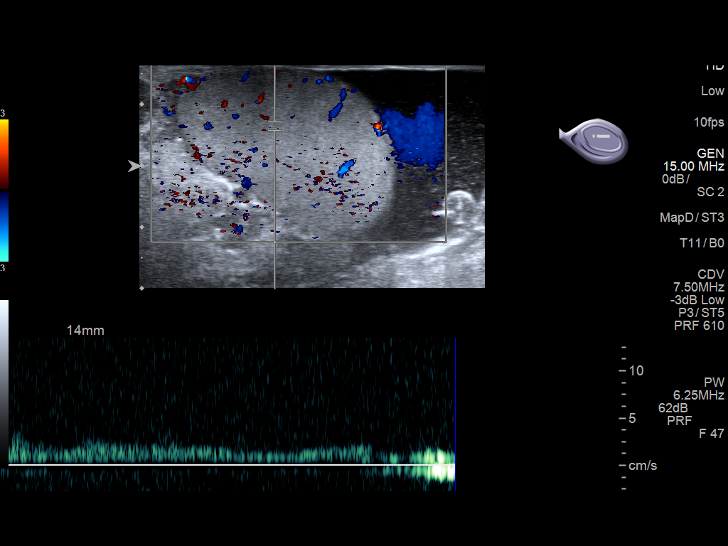
[im 37/80]
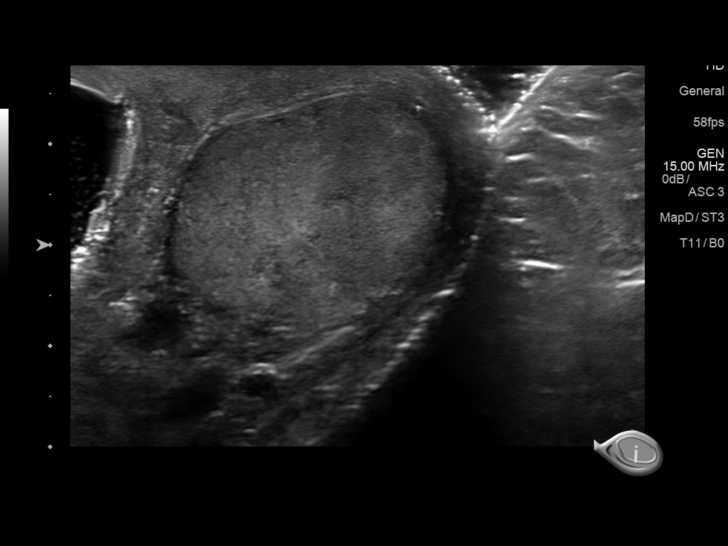
[im 43/80]
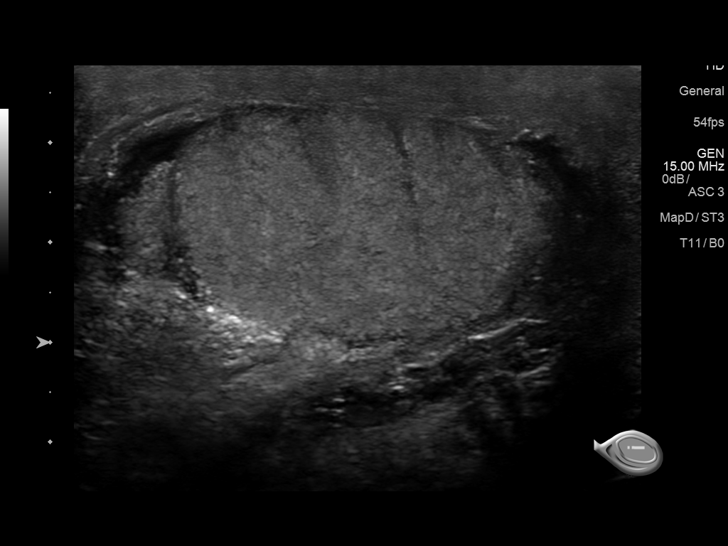
[im 50/80]
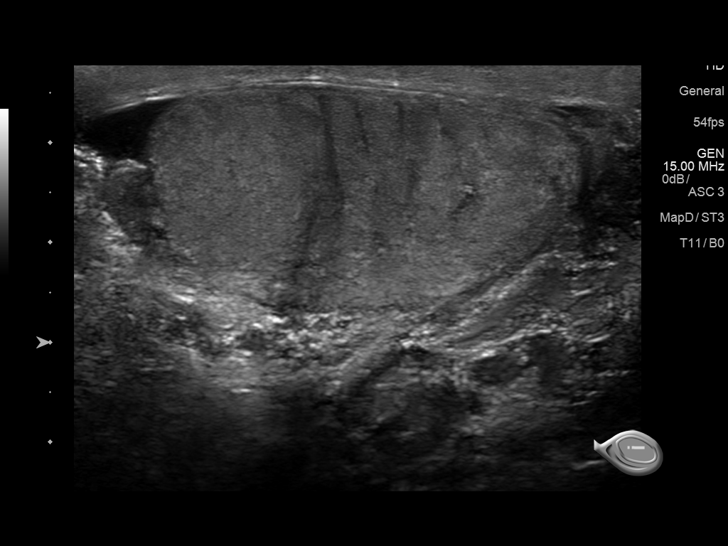
[im 53/80]
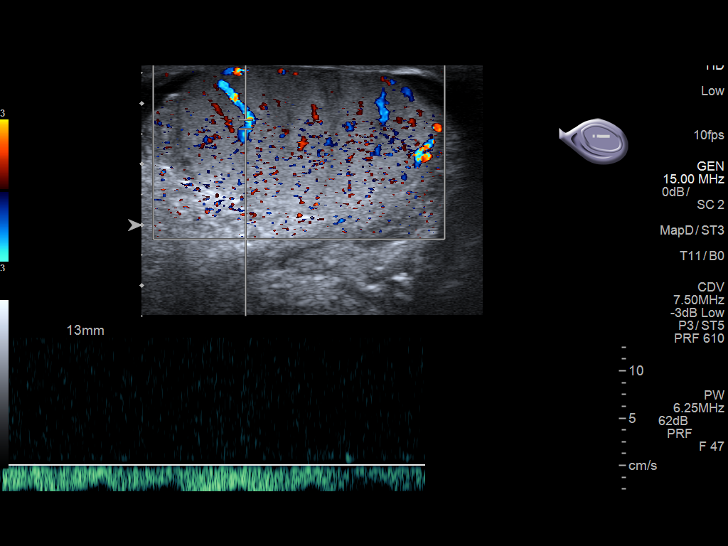
[im 60/80]
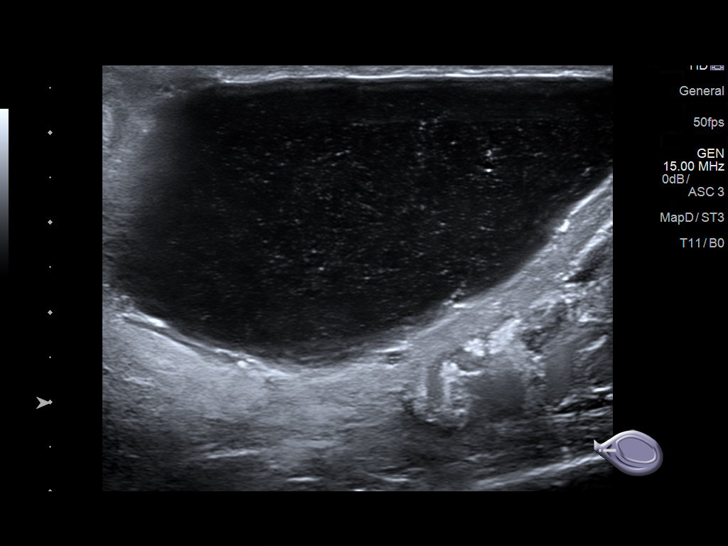
[im 66/80]
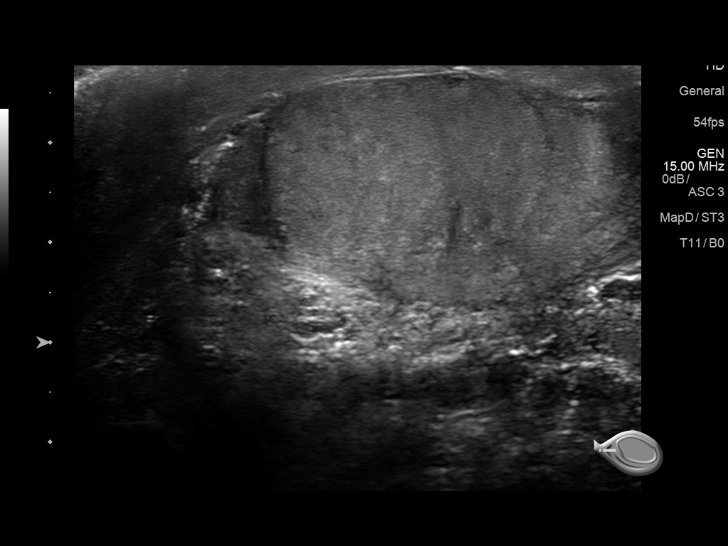
[im 73/80]
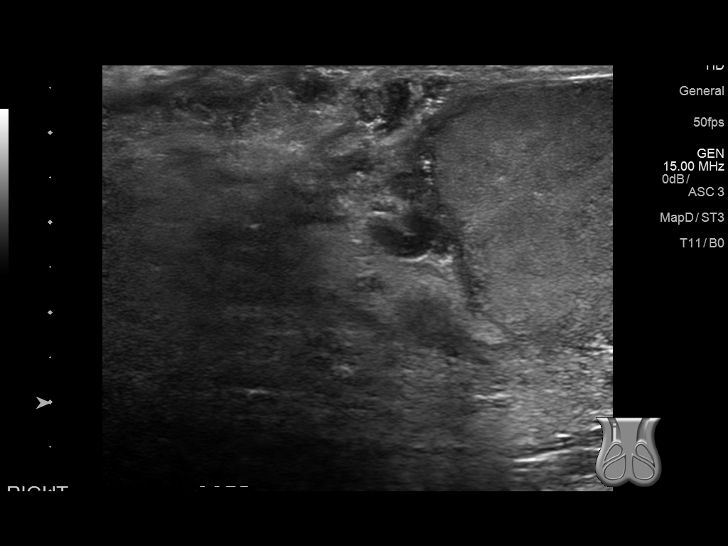
[im 80/80]
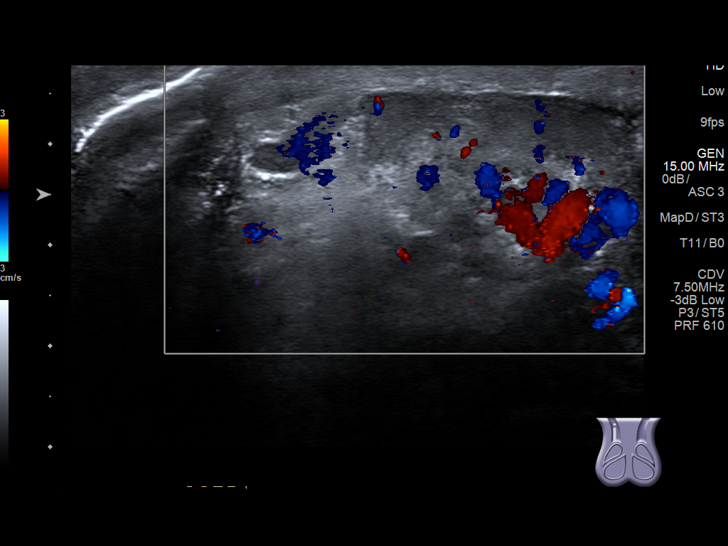

[14 of 25 positions shown; findings below may reference images not displayed]

FINDINGS: Right testicle

Measurements: 4.4 x 3.5 x 3.0 cm. No mass or microlithiasis
visualized.

Left testicle

Measurements: 4.4 x 2.7 x 2.3 cm. No mass or microlithiasis
visualized.

Right epididymis:  Normal in size and appearance.

Left epididymis:  Normal in size and appearance.

Hydrocele: Moderate right hydrocele is noted with internal echoes
suggesting debris, possibly inflammatory in etiology.

Varicocele:  Moderate bilateral varicoceles are noted.
IMPRESSION: No evidence testicular mass or torsion is noted.

Moderate right hydrocele is noted with internal echoes, suggesting
possible inflammatory debris.

Moderate bilateral varicoceles are noted.

## 2018-03-31 ENCOUNTER — Telehealth (INDEPENDENT_AMBULATORY_CARE_PROVIDER_SITE_OTHER): Payer: Self-pay | Admitting: *Deleted

## 2018-03-31 ENCOUNTER — Encounter (INDEPENDENT_AMBULATORY_CARE_PROVIDER_SITE_OTHER): Payer: Self-pay | Admitting: *Deleted

## 2018-03-31 NOTE — Telephone Encounter (Signed)
Patient needs trilyte 

## 2018-04-01 MED ORDER — PEG 3350-KCL-NA BICARB-NACL 420 G PO SOLR: 4000.0000 mL | Freq: Once | ORAL | 0 refills | Status: AC

## 2018-04-12 DIAGNOSIS — R7301 Impaired fasting glucose: Secondary | ICD-10-CM | POA: Diagnosis not present

## 2018-04-12 DIAGNOSIS — D649 Anemia, unspecified: Secondary | ICD-10-CM | POA: Diagnosis not present

## 2018-04-12 DIAGNOSIS — E785 Hyperlipidemia, unspecified: Secondary | ICD-10-CM | POA: Diagnosis not present

## 2018-04-12 DIAGNOSIS — N400 Enlarged prostate without lower urinary tract symptoms: Secondary | ICD-10-CM | POA: Diagnosis not present

## 2018-04-12 DIAGNOSIS — E291 Testicular hypofunction: Secondary | ICD-10-CM | POA: Diagnosis not present

## 2018-04-15 ENCOUNTER — Ambulatory Visit (INDEPENDENT_AMBULATORY_CARE_PROVIDER_SITE_OTHER): Payer: Medicare Other | Admitting: Cardiovascular Disease

## 2018-04-15 ENCOUNTER — Encounter

## 2018-04-15 ENCOUNTER — Encounter: Payer: Self-pay | Admitting: Cardiovascular Disease

## 2018-04-15 VITALS — BP 130/78 | HR 61 | Ht 69.0 in | Wt 249.0 lb

## 2018-04-15 DIAGNOSIS — R7301 Impaired fasting glucose: Secondary | ICD-10-CM | POA: Diagnosis not present

## 2018-04-15 DIAGNOSIS — E291 Testicular hypofunction: Secondary | ICD-10-CM | POA: Diagnosis not present

## 2018-04-15 DIAGNOSIS — R002 Palpitations: Secondary | ICD-10-CM | POA: Diagnosis not present

## 2018-04-15 DIAGNOSIS — N400 Enlarged prostate without lower urinary tract symptoms: Secondary | ICD-10-CM | POA: Diagnosis not present

## 2018-04-15 DIAGNOSIS — N433 Hydrocele, unspecified: Secondary | ICD-10-CM | POA: Diagnosis not present

## 2018-04-15 DIAGNOSIS — N486 Induration penis plastica: Secondary | ICD-10-CM | POA: Diagnosis not present

## 2018-04-15 DIAGNOSIS — M1712 Unilateral primary osteoarthritis, left knee: Secondary | ICD-10-CM | POA: Diagnosis not present

## 2018-04-15 NOTE — Patient Instructions (Signed)
Medication Instructions:  Your physician recommends that you continue on your current medications as directed. Please refer to the Current Medication list given to you today.   Labwork: none  Testing/Procedures: Your physician has requested that you have an echocardiogram. Echocardiography is a painless test that uses sound waves to create images of your heart. It provides your doctor with information about the size and shape of your heart and how well your heart's chambers and valves are working. This procedure takes approximately one hour. There are no restrictions for this procedure.  Your physician has recommended that you wear an event monitor. Event monitors are medical devices that record the heart's electrical activity. Doctors most often [place] these monitors to diagnose arrhythmias. Arrhythmias are problems with the speed or rhythm of the heartbeat. The monitor is a small, portable device. You can wear one while you do your normal daily activities. This is usually used to diagnose what is causing palpitations/syncope (passing out).    Follow-Up: Your physician recommends that you schedule a follow-up appointment in: 4 months    Any Other Special Instructions Will Be Listed Below (If Applicable).     If you need a refill on your cardiac medications before your next appointment, please call your pharmacy.

## 2018-04-15 NOTE — Progress Notes (Signed)
SUBJECTIVE: The patient presents for the evaluation of palpitations.  I saw him for this back in October 2018 and thought they may have been due to PACs.  I reviewed notes from his PCP.  ECG performed in the office today which I ordered and personally interpreted demonstrates sinus bradycardia, 54 bpm, old inferior infarct, diffuse nonspecific T wave abnormalities and T wave inversions in high lateral leads suggestive of high lateral ischemia.  He is here with his wife.  About a week and a half ago, he had been experiencing palpitations that lasted for approximately a week which spontaneously subsided.  Prior to this symptoms occurred for a day.  They can occur sporadically.  He denies associated chest pain, lightheadedness, and dizziness.  He has occasional exertional dyspnea which he attributes to being out of shape.  Dad noticed that his blood pressures have been much more elevated than usual with readings as high as 177/90.  Overall his energy levels have improved since going on testosterone.  His wife says that he snores but denies witnessed apneic episodes.  She has heart disease and sees Dr. Claiborne Billings.  I reviewed labs dated 04/12/2018: White blood cells 5.6, hemoglobin 16.9, platelets 236, BUN 14, creatinine 1.06, sodium 142, potassium 4.6, total cholesterol 159, triglyceride 65, HDL 42, LDL 104, HbA1c 5.6%, serum ferritin low at 28.   Review of Systems: As per "subjective", otherwise negative.  Allergies  Allergen Reactions  . Etodolac Hives    Hives all over  . Hydrocodone Itching    nausea    Current Outpatient Medications  Medication Sig Dispense Refill  . aspirin EC 81 MG tablet Take 81 mg by mouth daily.    . celecoxib (CELEBREX) 200 MG capsule Take 200 mg by mouth 2 (two) times daily.    . cetirizine (ZYRTEC) 10 MG tablet Take 10 mg by mouth daily.    . pentoxifylline (TRENTAL) 400 MG CR tablet Take 400 mg by mouth 3 (three) times daily with meals.    Marland Kitchen  testosterone cypionate (DEPOTESTOSTERONE CYPIONATE) 200 MG/ML injection INJECT 0.5 MLS INTO THE MUSCLE ONCE A WEEK  1  . traMADol (ULTRAM) 50 MG tablet Take 50 mg by mouth every 6 (six) hours as needed.     No current facility-administered medications for this visit.     Past Medical History:  Diagnosis Date  . Arthritis   . BPH (benign prostatic hyperplasia)   . Cancer ([place])    skin ca  . Elevated uric acid in blood   . Gallstones   . GERD (gastroesophageal reflux disease)   . PONV (postoperative nausea and vomiting)     Past Surgical History:  Procedure Laterality Date  . CHOLECYSTECTOMY N/A 02/25/2013   Procedure: LAPAROSCOPIC CHOLECYSTECTOMY;  Surgeon: Jamesetta So, MD;  Location: [facility];  Service: General;  Laterality: N/A;  . COLONOSCOPY N/A 12/29/2012   Procedure: COLONOSCOPY;  Surgeon: Rogene Houston, MD;  Location: [facility];  Service: Endoscopy;  Laterality: N/A;  300-moved to 200 Ann to notify pt  . EYE SURGERY     Shit with an arrow. Can see some out of his eye    Social History   Socioeconomic History  . Marital status: Married    Spouse name: Not on file  . Number of children: Not on file  . Years of education: Not on file  . Highest education level: Not on file  Occupational History  . Not on file  Social Needs  .  Financial resource strain: Not on file  . Food insecurity:    Worry: Not on file    Inability: Not on file  . Transportation needs:    Medical: Not on file    Non-medical: Not on file  Tobacco Use  . Smoking status: Former Research scientist (life sciences)  . Smokeless tobacco: Current User    Types: Chew  . Tobacco comment: quit years ago  Substance and Sexual Activity  . Alcohol use: Yes    Comment: occasionally wine, not often  . Drug use: No  . Sexual activity: Yes    Birth control/protection: Sponge  Lifestyle  . Physical activity:    Days per week: Not on file    Minutes per session: Not on file  . Stress: Not on file  Relationships  . Social  connections:    Talks on phone: Not on file    Gets together: Not on file    Attends religious service: Not on file    Active member of club or organization: Not on file    Attends meetings of clubs or organizations: Not on file    Relationship status: Not on file  . Intimate partner violence:    Fear of current or ex partner: Not on file    Emotionally abused: Not on file    Physically abused: Not on file    Forced sexual activity: Not on file  Other Topics Concern  . Not on file  Social History Narrative  . Not on file     Vitals:   04/15/18 1116  BP: 130/78  Pulse: 61  SpO2: 91%  Weight: 249 lb (112.9 kg)  Height: 5\' 9"  (1.753 m)    Wt Readings from Last 3 Encounters:  04/15/18 249 lb (112.9 kg)  02/04/17 248 lb 8.6 oz (112.7 kg)  02/02/17 250 lb (113.4 kg)     PHYSICAL EXAM General: NAD HEENT: Normal. Neck: No JVD, no thyromegaly. Lungs: Clear to auscultation bilaterally with normal respiratory effort. CV: Regular rate and rhythm, normal S1/S2, no S3/S4, no murmur. No pretibial or periankle edema.  No carotid bruit.   Abdomen: Soft, nontender, no distention.  Neurologic: Alert and oriented.  Psych: Normal affect. Skin: Normal. Musculoskeletal: No gross deformities.    ECG: Reviewed above under Subjective   Labs: Lab Results  Component Value Date/Time   K 3.6 01/10/2017 11:32 PM   BUN 12 01/10/2017 11:32 PM   CREATININE 0.86 01/10/2017 11:32 PM   ALT 20 01/10/2017 11:32 PM   TSH 3.982 01/30/2011 11:00 PM   HGB 12.3 (L) 01/10/2017 11:32 PM     Lipids: No results found for: LDLCALC, LDLDIRECT, CHOL, TRIG, HDL     ASSESSMENT AND PLAN: 1.  Palpitations: I am uncertain if he is experiencing symptomatic PACs.  He is currently bradycardic and thus I am not inclined to start AV nodal blocking therapy.  Labs reviewed above and found to be unremarkable.  While he has a history of snoring there have been no witnessed apneic episodes to suggest sleep  disordered breathing. I will proceed with a 30-day event monitor. I will order a 2-D echocardiogram with Doppler to evaluate cardiac structure, function, and regional wall motion.    Disposition: Follow up 4 months   Kate Sable, M.D., F.A.C.C.

## 2018-04-19 ENCOUNTER — Telehealth (INDEPENDENT_AMBULATORY_CARE_PROVIDER_SITE_OTHER): Payer: Self-pay | Admitting: *Deleted

## 2018-04-19 NOTE — Telephone Encounter (Signed)
Referring MD/PCP: hall   Procedure: tcs  Reason/Indication:  Hx polys  Has patient had this procedure before?  Yes, 2014  If so, when, by whom and where?    Is there a family history of colon cancer?  no  Who?  What age when diagnosed?    Is patient diabetic?   no      Does patient have prosthetic heart valve or mechanical valve?  no  Do you have a pacemaker?  no  Has patient ever had endocarditis? no  Has patient had joint replacement within last 12 months?  no  Is patient constipated or do they take laxatives? no  Does patient have a history of alcohol/drug use?  no  Is patient on blood thinner such as Coumadin, Plavix and/or Aspirin? no  Medications: tramadol 50 mg prn, omeprazole 20 mg daily, celecoxib 200 mg daily, pentoxifyline 400 mg tid  Allergies: etodolac, hydrocodone  Medication Adjustment per Dr Lindi Adie, NP:   Procedure date & time: 05/19/18 at 1030

## 2018-04-22 ENCOUNTER — Ambulatory Visit (INDEPENDENT_AMBULATORY_CARE_PROVIDER_SITE_OTHER): Payer: Medicare Other

## 2018-04-22 DIAGNOSIS — R002 Palpitations: Secondary | ICD-10-CM | POA: Diagnosis not present

## 2018-04-22 NOTE — Telephone Encounter (Signed)
agree

## 2018-04-23 ENCOUNTER — Ambulatory Visit (HOSPITAL_COMMUNITY)
Admission: RE | Admit: 2018-04-23 | Discharge: 2018-04-23 | Disposition: A | Discharge status: Home / Self Care | Payer: Medicare Other | Source: Ambulatory Visit | Attending: Cardiovascular Disease | Admitting: Cardiovascular Disease

## 2018-04-23 DIAGNOSIS — I517 Cardiomegaly: Secondary | ICD-10-CM | POA: Diagnosis not present

## 2018-04-23 DIAGNOSIS — R002 Palpitations: Secondary | ICD-10-CM

## 2018-04-23 DIAGNOSIS — Z859 Personal history of malignant neoplasm, unspecified: Secondary | ICD-10-CM | POA: Diagnosis not present

## 2018-04-23 DIAGNOSIS — K219 Gastro-esophageal reflux disease without esophagitis: Secondary | ICD-10-CM | POA: Insufficient documentation

## 2018-04-23 NOTE — Progress Notes (Signed)
*  PRELIMINARY RESULTS* Echocardiogram 2D Echocardiogram has been performed.  Caleb Reed 04/23/2018, 9:08 AM

## 2018-05-11 ENCOUNTER — Encounter (INDEPENDENT_AMBULATORY_CARE_PROVIDER_SITE_OTHER): Payer: Self-pay | Admitting: *Deleted

## 2018-05-11 ENCOUNTER — Telehealth (INDEPENDENT_AMBULATORY_CARE_PROVIDER_SITE_OTHER): Payer: Self-pay | Admitting: *Deleted

## 2018-05-11 MED ORDER — SUPREP BOWEL PREP KIT 17.5-3.13-1.6 GM/177ML PO SOLN: 1.0000 | Freq: Once | ORAL | 0 refills | Status: AC

## 2018-05-11 NOTE — Telephone Encounter (Signed)
Patient needs suprep 

## 2018-05-13 ENCOUNTER — Telehealth: Payer: Self-pay | Admitting: Cardiovascular Disease

## 2018-05-13 NOTE — Telephone Encounter (Signed)
Pt will take monitor off 2 days early.

## 2018-05-13 NOTE — Telephone Encounter (Signed)
Wanting to know if it's ok for him to stop wearing his monitor, he's having a colonoscopy on 05/19/2018 and would like to take it off before then.

## 2018-05-19 ENCOUNTER — Encounter (HOSPITAL_COMMUNITY)
Admission: RE | Disposition: A | Discharge status: Home / Self Care | Payer: Self-pay | Source: Home / Self Care | Attending: Internal Medicine

## 2018-05-19 ENCOUNTER — Ambulatory Visit (HOSPITAL_COMMUNITY)
Admission: RE | Admit: 2018-05-19 | Discharge: 2018-05-19 | Disposition: A | Discharge status: Home / Self Care | Payer: Medicare Other | Attending: Internal Medicine | Admitting: Internal Medicine

## 2018-05-19 ENCOUNTER — Other Ambulatory Visit: Payer: Self-pay

## 2018-05-19 ENCOUNTER — Encounter (HOSPITAL_COMMUNITY): Payer: Self-pay | Admitting: *Deleted

## 2018-05-19 DIAGNOSIS — M1990 Unspecified osteoarthritis, unspecified site: Secondary | ICD-10-CM | POA: Diagnosis not present

## 2018-05-19 DIAGNOSIS — Z87891 Personal history of nicotine dependence: Secondary | ICD-10-CM | POA: Insufficient documentation

## 2018-05-19 DIAGNOSIS — Z85828 Personal history of other malignant neoplasm of skin: Secondary | ICD-10-CM | POA: Insufficient documentation

## 2018-05-19 DIAGNOSIS — Z79899 Other long term (current) drug therapy: Secondary | ICD-10-CM | POA: Insufficient documentation

## 2018-05-19 DIAGNOSIS — Z860100 Personal history of colon polyps, unspecified: Secondary | ICD-10-CM | POA: Insufficient documentation

## 2018-05-19 DIAGNOSIS — Q438 Other specified congenital malformations of intestine: Secondary | ICD-10-CM | POA: Insufficient documentation

## 2018-05-19 DIAGNOSIS — Z791 Long term (current) use of non-steroidal anti-inflammatories (NSAID): Secondary | ICD-10-CM | POA: Insufficient documentation

## 2018-05-19 DIAGNOSIS — Z79890 Hormone replacement therapy (postmenopausal): Secondary | ICD-10-CM | POA: Diagnosis not present

## 2018-05-19 DIAGNOSIS — Z1211 Encounter for screening for malignant neoplasm of colon: Secondary | ICD-10-CM | POA: Insufficient documentation

## 2018-05-19 DIAGNOSIS — K5730 Diverticulosis of large intestine without perforation or abscess without bleeding: Secondary | ICD-10-CM | POA: Insufficient documentation

## 2018-05-19 DIAGNOSIS — Z09 Encounter for follow-up examination after completed treatment for conditions other than malignant neoplasm: Secondary | ICD-10-CM | POA: Diagnosis not present

## 2018-05-19 HISTORY — DX: Cardiac arrhythmia, unspecified: I49.9

## 2018-05-19 HISTORY — PX: COLONOSCOPY: SHX5424

## 2018-05-19 SURGERY — COLONOSCOPY
Anesthesia: Moderate Sedation

## 2018-05-19 MED ORDER — MIDAZOLAM HCL 5 MG/5ML IJ SOLN
INTRAMUSCULAR | Status: AC
  Filled 2018-05-19: qty 10

## 2018-05-19 MED ORDER — MEPERIDINE HCL 50 MG/ML IJ SOLN
INTRAMUSCULAR | Status: DC | PRN
  Administered 2018-05-19 (×2): 25 mg via INTRAVENOUS

## 2018-05-19 MED ORDER — STERILE WATER FOR IRRIGATION IR SOLN
Status: DC | PRN
  Administered 2018-05-19: 11:00:00

## 2018-05-19 MED ORDER — MIDAZOLAM HCL 5 MG/5ML IJ SOLN
INTRAMUSCULAR | Status: DC | PRN
  Administered 2018-05-19 (×2): 1 mg via INTRAVENOUS
  Administered 2018-05-19 (×2): 2 mg via INTRAVENOUS

## 2018-05-19 MED ORDER — SODIUM CHLORIDE 0.9 % IV SOLN
INTRAVENOUS | Status: DC
  Administered 2018-05-19: 1000 mL via INTRAVENOUS

## 2018-05-19 MED ORDER — MEPERIDINE HCL 50 MG/ML IJ SOLN
INTRAMUSCULAR | Status: AC
  Filled 2018-05-19: qty 1

## 2018-05-19 NOTE — Op Note (Signed)
[facility] Patient Name: Caleb Reed Procedure Date: 05/19/2018 10:18 AM MRN: [id number] Date of Birth: 1950-05-05 Attending MD: Hildred Laser , MD CSN: [id number] Age: 68 Admit Type: Outpatient Procedure:                Colonoscopy Indications:              High risk colon cancer surveillance: Personal                            history of colonic polyps Providers:                Hildred Laser, MD, Otis Peak B. Sharon Seller, RN, Gerome Sam, RN, Randa Spike, Technician Referring MD:             Delphina Cahill, MD Medicines:                Meperidine 50 mg IV, Midazolam 6 mg IV Complications:            No immediate complications. Estimated Blood Loss:     Estimated blood loss: none. Procedure:                Pre-Anesthesia Assessment:                           - Prior to the procedure, a History and Physical                            was performed, and patient medications and                            allergies were reviewed. The patient's tolerance of                            previous anesthesia was also reviewed. The risks                            and benefits of the procedure and the sedation                            options and risks were discussed with the patient.                            All questions were answered, and informed consent                            was obtained. Prior Anticoagulants: The patient                            last took Celebrex (celecoxib) 2 days prior to the                            procedure. ASA Grade Assessment: II - A patient  with mild systemic disease. After reviewing the                            risks and benefits, the patient was deemed in                            satisfactory condition to undergo the procedure.                           After obtaining informed consent, the colonoscope                            was passed under direct vision. Throughout the                      procedure, the patient's blood pressure, pulse, and                            oxygen saturations were monitored continuously. The                            [id number]-[id number] ([id number]) scope was introduced through                            the anus and advanced to the the cecum, identified                            by appendiceal orifice and ileocecal valve. The                            colonoscopy was somewhat difficult due to a                            tortuous colon. The patient tolerated the procedure                            well. The quality of the bowel preparation was                            good. The ileocecal valve, appendiceal orifice, and                            rectum were photographed. Scope In: 10:45:03 AM Scope Out: 11:06:36 AM Scope Withdrawal Time: 0 hours 12 minutes 13 seconds  Total Procedure Duration: 0 hours 21 minutes 33 seconds  Findings:      The perianal and digital rectal examinations were normal.      Scattered medium-mouthed diverticula were found in the sigmoid colon.      The exam was otherwise normal throughout the examined colon.      The retroflexed view of the distal rectum and anal verge was normal       except thickened anoderm. Impression:               - Diverticulosis in the sigmoid colon.                           -  Thickened ano derm.                           - No specimens collected. Moderate Sedation:      Moderate (conscious) sedation was administered by the endoscopy nurse       and supervised by the endoscopist. The following parameters were       monitored: oxygen saturation, heart rate, blood pressure, CO2       capnography and response to care. Total physician intraservice time was       26 minutes. Recommendation:           - Patient has a contact number available for                            emergencies. The signs and symptoms of potential                            delayed complications were discussed  with the                            patient. Return to normal activities tomorrow.                            Written discharge instructions were provided to the                            patient.                           - High fiber diet today.                           - Continue present medications.                           - Repeat colonoscopy in 5 years for surveillance. Procedure Code(s):        --- Professional ---                           [phone], Colonoscopy, flexible; diagnostic, including                            collection of specimen(s) by brushing or washing,                            when performed (separate procedure)                           99153, Moderate sedation; each additional 15                            minutes intraservice time                           G0500, Moderate sedation services provided by the  same physician or other qualified health care                            professional performing a gastrointestinal                            endoscopic service that sedation supports,                            requiring the presence of an independent trained                            observer to assist in the monitoring of the                            patient's level of consciousness and physiological                            status; initial 15 minutes of intra-service time;                            patient age 75 years or older (additional time may                            be reported with [phone], as appropriate) Diagnosis Code(s):        --- Professional ---                           Z86.010, Personal history of colonic polyps                           K57.30, Diverticulosis of large intestine without                            perforation or abscess without bleeding CPT copyright 2018 [employer] Medical [employer]. All rights reserved. The codes documented in this report are preliminary and upon coder review may  be  revised to meet current compliance requirements. Hildred Laser, MD Hildred Laser, MD 05/19/2018 11:20:17 AM This report has been signed electronically. Number of Addenda: 0

## 2018-05-19 NOTE — Discharge Instructions (Signed)
Resume usual medications as before. High-fiber diet. No driving for 24 hours. Next examination in 5 years.      Colonoscopy, Adult, Care After This sheet gives you information about how to care for yourself after your procedure. Your doctor may also give you more specific instructions. If you have problems or questions, call your doctor. What can I expect after the procedure? After the procedure, it is common to have:  A small amount of blood in your poop for 24 hours.  Some gas.  Mild cramping or bloating in your belly. Follow these instructions at home: General instructions  For the first 24 hours after the procedure: ? Do not drive or use machinery. ? Do not sign important documents. ? Do not drink alcohol. ? Do your daily activities more slowly than normal. ? Eat foods that are soft and easy to digest.  Take over-the-counter or prescription medicines only as told by your doctor. To help cramping and bloating:   Try walking around.  Put heat on your belly (abdomen) as told by your doctor. Use a heat source that your doctor recommends, such as a moist heat pack or a heating pad. ? Put a towel between your skin and the heat source. ? Leave the heat on for 20-30 minutes. ? Remove the heat if your skin turns bright red. This is especially important if you cannot feel pain, heat, or cold. You can get burned. Eating and drinking   Drink enough fluid to keep your pee (urine) clear or pale yellow.  Return to your normal diet as told by your doctor. Avoid heavy or fried foods that are hard to digest.  Avoid drinking alcohol for as long as told by your doctor. Contact a doctor if:  You have blood in your poop (stool) 2-3 days after the procedure. Get help right away if:  You have more than a small amount of blood in your poop.  You see large clumps of tissue (blood clots) in your poop.  Your belly is swollen.  You feel sick to your stomach (nauseous).  You throw  up (vomit).  You have a fever.  You have belly pain that gets worse, and medicine does not help your pain. Summary  After the procedure, it is common to have a small amount of blood in your poop. You may also have mild cramping and bloating in your belly.  For the first 24 hours after the procedure, do not drive or use machinery, do not sign important documents, and do not drink alcohol.  Get help right away if you have a lot of blood in your poop, feel sick to your stomach, have a fever, or have more belly pain. This information is not intended to replace advice given to you by your health care provider. Make sure you discuss any questions you have with your health care provider. Document Released: 05/17/2010 Document Revised: 02/12/2017 Document Reviewed: 01/07/2016 Elsevier Interactive Patient Education  2019 [employer] Inc[employer]     High-Fiber Diet Fiber, also called dietary fiber, is a type of carbohydrate that is found in fruits, vegetables, whole grains, and beans. A high-fiber diet can have many health benefits. Your health care provider may recommend a high-fiber diet to help:  Prevent constipation. Fiber can make your bowel movements more regular.  Lower your cholesterol.  Relieve the following conditions: ? Swelling of veins in the anus (hemorrhoids). ? Swelling and irritation (inflammation) of specific areas of the digestive tract (uncomplicated diverticulosis). ? A problem  of the large intestine (colon) that sometimes causes pain and diarrhea (irritable bowel syndrome, IBS).  Prevent overeating as part of a weight-loss plan.  Prevent heart disease, type 2 diabetes, and certain cancers. What is my plan? The recommended daily fiber intake in grams (g) includes:  38 g for men age 78 or younger.  30 g for men over age 44.  15 g for women age 42 or younger.  21 g for women over age 70. You can get the recommended daily intake of dietary fiber by:  Eating a variety of  fruits, vegetables, grains, and beans.  Taking a fiber supplement, if it is not possible to get enough fiber through your diet. What do I need to know about a high-fiber diet?  It is better to get fiber through food sources rather than from fiber supplements. There is not a lot of research about how effective supplements are.  Always check the fiber content on the nutrition facts label of any prepackaged food. Look for foods that contain 5 g of fiber or more per serving.  Talk with a diet and nutrition specialist (dietitian) if you have questions about specific foods that are recommended or not recommended for your medical condition, especially if those foods are not listed below.  Gradually increase how much fiber you consume. If you increase your intake of dietary fiber too quickly, you may have bloating, cramping, or gas.  Drink plenty of water. Water helps you to digest fiber. What are tips for following this plan?  Eat a wide variety of high-fiber foods.  Make sure that half of the grains that you eat each day are whole grains.  Eat breads and cereals that are made with whole-grain flour instead of refined flour or white flour.  Eat brown rice, bulgur wheat, or millet instead of white rice.  Start the day with a breakfast that is high in fiber, such as a cereal that contains 5 g of fiber or more per serving.  Use beans in place of meat in soups, salads, and pasta dishes.  Eat high-fiber snacks, such as berries, raw vegetables, nuts, and popcorn.  Choose whole fruits and vegetables instead of processed forms like juice or sauce. What foods can I eat?  Fruits Berries. Pears. Apples. Oranges. Avocado. Prunes and raisins. Dried figs. Vegetables Sweet potatoes. Spinach. Kale. Artichokes. Cabbage. Broccoli. Cauliflower. Green peas. Carrots. Squash. Grains Whole-grain breads. Multigrain cereal. Oats and oatmeal. Brown rice. Barley. Bulgur wheat. [place]. Quinoa. Bran muffins.  Popcorn. Rye wafer crackers. Meats and other proteins Navy, kidney, and pinto beans. Soybeans. Split peas. Lentils. Nuts and seeds. Dairy Fiber-fortified yogurt. Beverages Fiber-fortified soy milk. Fiber-fortified orange juice. Other foods Fiber bars. The items listed above may not be a complete list of recommended foods and beverages. Contact a dietitian for more options. What foods are not recommended? Fruits Fruit juice. Cooked, strained fruit. Vegetables Fried potatoes. Canned vegetables. Well-cooked vegetables. Grains White bread. Pasta made with refined flour. White rice. Meats and other proteins Fatty cuts of meat. Fried chicken or fried fish. Dairy Milk. Yogurt. Cream cheese. Sour cream. Fats and oils Butters. Beverages Soft drinks. Other foods Cakes and pastries. The items listed above may not be a complete list of foods and beverages to avoid. Contact a dietitian for more information. Summary  Fiber is a type of carbohydrate. It is found in fruits, vegetables, whole grains, and beans.  There are many health benefits of eating a high-fiber diet, such as preventing constipation, lowering  blood cholesterol, helping with weight loss, and reducing your risk of heart disease, diabetes, and certain cancers.  Gradually increase your intake of fiber. Increasing too fast can result in cramping, bloating, and gas. Drink plenty of water while you increase your fiber.  The best sources of fiber include whole fruits and vegetables, whole grains, nuts, seeds, and beans. This information is not intended to replace advice given to you by your health care provider. Make sure you discuss any questions you have with your health care provider. Document Released: 04/14/2005 Document Revised: 02/16/2017 Document Reviewed: 02/16/2017 Elsevier Interactive Patient Education  2019 [employer].    Diverticulosis  Diverticulosis is a condition that develops when small pouches  (diverticula) form in the wall of the large intestine (colon). The colon is where water is absorbed and stool is formed. The pouches form when the inside layer of the colon pushes through weak spots in the outer layers of the colon. You may have a few pouches or many of them. What are the causes? The cause of this condition is not known. What increases the risk? The following factors may make you more likely to develop this condition:  Being older than age 32. Your risk for this condition increases with age. Diverticulosis is rare among people younger than age 42. By age 52, many people have it.  Eating a low-fiber diet.  Having frequent constipation.  Being overweight.  Not getting enough exercise.  Smoking.  Taking over-the-counter pain medicines, like aspirin and ibuprofen.  Having a family history of diverticulosis. What are the signs or symptoms? In most people, there are no symptoms of this condition. If you do have symptoms, they may include:  Bloating.  Cramps in the abdomen.  Constipation or diarrhea.  Pain in the lower left side of the abdomen. How is this diagnosed? This condition is most often diagnosed during an exam for other colon problems. Because diverticulosis usually has no symptoms, it often cannot be diagnosed independently. This condition may be diagnosed by:  Using a flexible scope to examine the colon (colonoscopy).  Taking an X-ray of the colon after dye has been put into the colon (barium enema).  Doing a CT scan. How is this treated? You may not need treatment for this condition if you have never developed an infection related to diverticulosis. If you have had an infection before, treatment may include:  Eating a high-fiber diet. This may include eating more fruits, vegetables, and grains.  Taking a fiber supplement.  Taking a live bacteria supplement (probiotic).  Taking medicine to relax your colon.  Taking antibiotic medicines. Follow  these instructions at home:  Drink 6-8 glasses of water or more each day to prevent constipation.  Try not to strain when you have a bowel movement.  If you have had an infection before: ? Eat more fiber as directed by your health care provider or your diet and nutrition specialist (dietitian). ? Take a fiber supplement or probiotic, if your health care provider approves.  Take over-the-counter and prescription medicines only as told by your health care provider.  If you were prescribed an antibiotic, take it as told by your health care provider. Do not stop taking the antibiotic even if you start to feel better.  Keep all follow-up visits as told by your health care provider. This is important. Contact a health care provider if:  You have pain in your abdomen.  You have bloating.  You have cramps.  You have not had  a bowel movement in 3 days. Get help right away if:  Your pain gets worse.  Your bloating becomes very bad.  You have a fever or chills, and your symptoms suddenly get worse.  You vomit.  You have bowel movements that are bloody or black.  You have bleeding from your rectum. Summary  Diverticulosis is a condition that develops when small pouches (diverticula) form in the wall of the large intestine (colon).  You may have a few pouches or many of them.  This condition is most often diagnosed during an exam for other colon problems.  If you have had an infection related to diverticulosis, treatment may include increasing the fiber in your diet, taking supplements, or taking medicines. This information is not intended to replace advice given to you by your health care provider. Make sure you discuss any questions you have with your health care provider. Document Released: 01/10/2004 Document Revised: 03/03/2016 Document Reviewed: 03/03/2016 Elsevier Interactive Patient Education  2019 [employer].

## 2018-05-19 NOTE — H&P (Signed)
Caleb Reed is an 68 y.o. male.   Chief Complaint: Patient is here for colonoscopy. HPI: Patient 68 year old Caucasian male who has history of colonic adenomas and is here for surveillance colonoscopy.  Last exam was in September 2014 with removal of 3 small polyps and these were tubular adenomas.  He denies abdominal pain change in bowel habits or rectal bleeding. Family history significant for cancers of the colon carcinoma and 5 brothers and they are all disease.  The ages were from 26s to 60.  One had pancreatic carcinoma the lung had renal 1 had throat cancer in 2 of lung cancer.  He has 2 sisters living in good health.  Both parents died of old age.  Past Medical History:  Diagnosis Date  . Arthritis   . BPH (benign prostatic hyperplasia)   . Cancer ([place])    skin ca  . Dysrhythmia   . Elevated uric acid in blood   . Gallstones   . GERD (gastroesophageal reflux disease)   . PONV (postoperative nausea and vomiting)     Past Surgical History:  Procedure Laterality Date  . CHOLECYSTECTOMY N/A 02/25/2013   Procedure: LAPAROSCOPIC CHOLECYSTECTOMY;  Surgeon: Jamesetta So, MD;  Location: [facility];  Service: General;  Laterality: N/A;  . COLONOSCOPY N/A 12/29/2012   Procedure: COLONOSCOPY;  Surgeon: Rogene Houston, MD;  Location: [facility];  Service: Endoscopy;  Laterality: N/A;  300-moved to 200 Ann to notify pt  . EYE SURGERY     Shit with an arrow. Can see some out of his eye    Family History  Problem Relation Age of Onset  . Colon cancer Neg Hx    Social History:  reports that he has quit smoking. He has quit using smokeless tobacco.  His smokeless tobacco use included chew. He reports current alcohol use. He reports that he does not use drugs.  Allergies:  Allergies  Allergen Reactions  . Etodolac Hives and Other (See Comments)    Hives all over  . Hydrocodone Itching and Nausea Only    Medications Prior to Admission  Medication Sig Dispense Refill  . celecoxib  (CELEBREX) 200 MG capsule Take 200 mg by mouth daily.     . Glycerin-Hypromellose-PEG 400 ([facility] DRY EYE RELIEF OP) Place 1 drop into both eyes daily as needed (for dry eyes).    Mable Fill (EYE ALLERGY RELIEF OP) Place 1 drop into both eyes daily as needed (for allergies).    . pentoxifylline (TRENTAL) 400 MG CR tablet Take 400 mg by mouth 3 (three) times daily with meals.    Marland Kitchen testosterone cypionate (DEPOTESTOSTERONE CYPIONATE) 200 MG/ML injection Inject 100 mg into the muscle every Tuesday.   1  . metroNIDAZOLE (METROCREAM) 0.75 % cream Apply 1 application topically 2 (two) times daily as needed (for red spots on face).    . traMADol (ULTRAM) 50 MG tablet Take 50 mg by mouth every 6 (six) hours as needed for moderate pain.       No results found for this or any previous visit (from the past 48 hour(s)). No results found.  ROS  Blood pressure (!) 150/71, pulse 70, temperature 98.2 F (36.8 C), temperature source Oral, resp. rate 15, height 5\' 9"  (1.753 m), weight 115.7 kg, SpO2 95 %. Physical Exam  Constitutional: He appears well-developed and well-nourished.  HENT:  Mouth/Throat: Oropharynx is clear and moist.  Eyes: Conjunctivae are normal. No scleral icterus.  Neck: No thyromegaly present.  Cardiovascular: Normal rate,  regular rhythm and normal heart sounds.  No murmur heard. Respiratory: Effort normal and breath sounds normal.  GI: Soft. He exhibits no distension and no mass. There is no abdominal tenderness.  Musculoskeletal:        General: No edema.  Lymphadenopathy:    He has no cervical adenopathy.  Neurological: He is alert.  Skin: Skin is warm.     Assessment/Plan History of colonic adenomas. Surveillance colonoscopy.  Hildred Laser, MD 05/19/2018, 10:35 AM

## 2018-05-21 ENCOUNTER — Telehealth (INDEPENDENT_AMBULATORY_CARE_PROVIDER_SITE_OTHER): Payer: Self-pay | Admitting: *Deleted

## 2018-05-21 ENCOUNTER — Encounter (HOSPITAL_COMMUNITY): Payer: Self-pay | Admitting: Internal Medicine

## 2018-05-21 NOTE — Telephone Encounter (Signed)
Patient's wife called, states you wanted him to stop one of his medications, she can't remember which one -- please advise

## 2018-05-25 NOTE — Telephone Encounter (Signed)
Patient's wife called and informed that if possible he can take Celebrex on as-needed basis but if he needs it every day then he can.

## 2018-05-28 ENCOUNTER — Telehealth: Payer: Self-pay | Admitting: *Deleted

## 2018-05-28 NOTE — Telephone Encounter (Signed)
-----   Message from Herminio Commons, MD sent at 05/28/2018  9:07 AM EST ----- No significant arrhythmias. Isolated extra beats (2 the entire monitoring period).

## 2018-05-28 NOTE — Telephone Encounter (Signed)
Called patient with test results. No answer.

## 2018-08-17 ENCOUNTER — Telehealth: Payer: 59 | Admitting: Cardiovascular Disease

## 2018-10-06 DIAGNOSIS — E291 Testicular hypofunction: Secondary | ICD-10-CM | POA: Diagnosis not present

## 2018-10-06 DIAGNOSIS — R7301 Impaired fasting glucose: Secondary | ICD-10-CM | POA: Diagnosis not present

## 2018-10-06 DIAGNOSIS — D649 Anemia, unspecified: Secondary | ICD-10-CM | POA: Diagnosis not present

## 2018-10-09 DIAGNOSIS — Z0000 Encounter for general adult medical examination without abnormal findings: Secondary | ICD-10-CM | POA: Diagnosis not present

## 2018-10-15 DIAGNOSIS — E291 Testicular hypofunction: Secondary | ICD-10-CM | POA: Diagnosis not present

## 2018-10-19 DIAGNOSIS — R002 Palpitations: Secondary | ICD-10-CM | POA: Diagnosis not present

## 2018-10-19 DIAGNOSIS — N486 Induration penis plastica: Secondary | ICD-10-CM | POA: Diagnosis not present

## 2018-10-19 DIAGNOSIS — M1712 Unilateral primary osteoarthritis, left knee: Secondary | ICD-10-CM | POA: Diagnosis not present

## 2018-10-19 DIAGNOSIS — N400 Enlarged prostate without lower urinary tract symptoms: Secondary | ICD-10-CM | POA: Diagnosis not present

## 2018-10-19 DIAGNOSIS — E291 Testicular hypofunction: Secondary | ICD-10-CM | POA: Diagnosis not present

## 2018-10-19 DIAGNOSIS — R7301 Impaired fasting glucose: Secondary | ICD-10-CM | POA: Diagnosis not present

## 2018-10-19 DIAGNOSIS — N433 Hydrocele, unspecified: Secondary | ICD-10-CM | POA: Diagnosis not present

## 2018-12-06 ENCOUNTER — Telehealth: Payer: 59 | Admitting: Cardiovascular Disease

## 2018-12-07 ENCOUNTER — Ambulatory Visit (INDEPENDENT_AMBULATORY_CARE_PROVIDER_SITE_OTHER): Payer: Medicare Other | Admitting: Urology

## 2018-12-07 ENCOUNTER — Other Ambulatory Visit: Payer: Self-pay

## 2018-12-07 DIAGNOSIS — N486 Induration penis plastica: Secondary | ICD-10-CM | POA: Diagnosis not present

## 2018-12-07 DIAGNOSIS — N5201 Erectile dysfunction due to arterial insufficiency: Secondary | ICD-10-CM | POA: Diagnosis not present

## 2018-12-08 ENCOUNTER — Encounter: Payer: Self-pay | Admitting: Cardiovascular Disease

## 2018-12-08 ENCOUNTER — Ambulatory Visit (INDEPENDENT_AMBULATORY_CARE_PROVIDER_SITE_OTHER): Payer: Medicare Other | Admitting: Cardiovascular Disease

## 2018-12-08 VITALS — BP 142/79 | HR 61 | Temp 97.7°F | Ht 69.0 in | Wt 248.0 lb

## 2018-12-08 DIAGNOSIS — R030 Elevated blood-pressure reading, without diagnosis of hypertension: Secondary | ICD-10-CM | POA: Diagnosis not present

## 2018-12-08 DIAGNOSIS — R002 Palpitations: Secondary | ICD-10-CM

## 2018-12-08 NOTE — Patient Instructions (Signed)
Medication Instructions:  Your physician recommends that you continue on your current medications as directed. Please refer to the Current Medication list given to you today.   Labwork: NONE  Testing/Procedures: NONE  Follow-Up: Your physician recommends that you schedule a follow-up appointment in: AS NEEDED      Any Other Special Instructions Will Be Listed Below (If Applicable).     If you need a refill on your cardiac medications before your next appointment, please call your pharmacy.   

## 2018-12-08 NOTE — Progress Notes (Signed)
SUBJECTIVE: The patient presents for follow-up of palpitations.  I last saw him in December 2019.  Echocardiogram on 04/23/2018 demonstrated normal left ventricular systolic function, LVEF 50 to 55%, mild LVH, grade 1 diastolic dysfunction, and mild left atrial dilatation.  Event monitoring demonstrated sinus rhythm and sinus bradycardia.  Throughout the entire monitoring period he only had 2 PACs.  There were no significant arrhythmias.  He told me he experiences palpitations about once or twice per year.  He denies chest pain, leg swelling, orthopnea, and shortness of breath.  He has been a Futures trader for 46 years.  Review of Systems: As per "subjective", otherwise negative.  Allergies  Allergen Reactions  . Etodolac Hives and Other (See Comments)    Hives all over  . Hydrocodone Itching and Nausea Only    Current Outpatient Medications  Medication Sig Dispense Refill  . Glycerin-Hypromellose-PEG 400 ([facility] DRY EYE RELIEF OP) Place 1 drop into both eyes daily as needed (for dry eyes).    Marland Kitchen ibuprofen (ADVIL) 200 MG tablet Take 200 mg by mouth every 6 (six) hours as needed.    . metroNIDAZOLE (METROCREAM) 0.75 % cream Apply 1 application topically 2 (two) times daily as needed (for red spots on face).    Mable Fill (EYE ALLERGY RELIEF OP) Place 1 drop into both eyes daily as needed (for allergies).    . pentoxifylline (TRENTAL) 400 MG CR tablet Take 400 mg by mouth 3 (three) times daily with meals.    Marland Kitchen testosterone cypionate (DEPOTESTOSTERONE CYPIONATE) 200 MG/ML injection Inject 100 mg into the muscle every Tuesday.   1  . traMADol (ULTRAM) 50 MG tablet Take 50 mg by mouth every 6 (six) hours as needed for moderate pain.      No current facility-administered medications for this visit.     Past Medical History:  Diagnosis Date  . Arthritis   . BPH (benign prostatic hyperplasia)   . Cancer ([place])    skin ca  . Dysrhythmia   . Elevated uric acid in blood    . Gallstones   . GERD (gastroesophageal reflux disease)   . PONV (postoperative nausea and vomiting)     Past Surgical History:  Procedure Laterality Date  . CHOLECYSTECTOMY N/A 02/25/2013   Procedure: LAPAROSCOPIC CHOLECYSTECTOMY;  Surgeon: Jamesetta So, MD;  Location: [facility];  Service: General;  Laterality: N/A;  . COLONOSCOPY N/A 12/29/2012   Procedure: COLONOSCOPY;  Surgeon: Rogene Houston, MD;  Location: [facility];  Service: Endoscopy;  Laterality: N/A;  300-moved to 200 Ann to notify pt  . COLONOSCOPY N/A 05/19/2018   Procedure: COLONOSCOPY;  Surgeon: Rogene Houston, MD;  Location: [facility];  Service: Endoscopy;  Laterality: N/A;  05/19/2018  . EYE SURGERY     Shit with an arrow. Can see some out of his eye    Social History   Socioeconomic History  . Marital status: Married    Spouse name: Not on file  . Number of children: Not on file  . Years of education: Not on file  . Highest education level: Not on file  Occupational History  . Not on file  Social Needs  . Financial resource strain: Not on file  . Food insecurity    Worry: Not on file    Inability: Not on file  . Transportation needs    Medical: Not on file    Non-medical: Not on file  Tobacco Use  . Smoking status: Former  Smoker  . Smokeless tobacco: Former Systems developer    Types: Chew  . Tobacco comment: quit years ago  Substance and Sexual Activity  . Alcohol use: Yes    Comment: occasionally wine, not often  . Drug use: No  . Sexual activity: Yes    Birth control/protection: Sponge  Lifestyle  . Physical activity    Days per week: Not on file    Minutes per session: Not on file  . Stress: Not on file  Relationships  . Social Herbalist on phone: Not on file    Gets together: Not on file    Attends religious service: Not on file    Active member of club or organization: Not on file    Attends meetings of clubs or organizations: Not on file    Relationship status: Not on file  .  Intimate partner violence    Fear of current or ex partner: Not on file    Emotionally abused: Not on file    Physically abused: Not on file    Forced sexual activity: Not on file  Other Topics Concern  . Not on file  Social History Narrative  . Not on file     Vitals:   12/08/18 0848  BP: (!) 142/79  Pulse: 61  Temp: 97.7 F (36.5 C)  Weight: 248 lb (112.5 kg)  Height: 5\' 9"  (1.753 m)    Wt Readings from Last 3 Encounters:  12/08/18 248 lb (112.5 kg)  05/19/18 255 lb (115.7 kg)  04/15/18 249 lb (112.9 kg)     PHYSICAL EXAM General: NAD HEENT: Normal. Neck: No JVD, no thyromegaly. Lungs: Clear to auscultation bilaterally with normal respiratory effort. CV: Regular rate and rhythm, normal S1/S2, no S3/S4, no murmur. No pretibial or periankle edema.   Abdomen: Soft, nontender, no distention.  Neurologic: Alert and oriented.  Psych: Normal affect. Skin: Normal. Musculoskeletal: No gross deformities.    ECG: Reviewed above under Subjective   Labs: Lab Results  Component Value Date/Time   K 3.6 01/10/2017 11:32 PM   BUN 12 01/10/2017 11:32 PM   CREATININE 0.86 01/10/2017 11:32 PM   ALT 20 01/10/2017 11:32 PM   TSH 3.982 01/30/2011 11:00 PM   HGB 12.3 (L) 01/10/2017 11:32 PM     Lipids: No results found for: LDLCALC, LDLDIRECT, CHOL, TRIG, HDL     ASSESSMENT AND PLAN: 1.  Palpitations: Symptomatically stable.  Cardiac testing reviewed above with no significant arrhythmias by event monitoring. LV systolic function is normal with mild left atrial enlargement.  2.  Elevated blood pressure: He does not carry diagnosis of hypertension.  This will need continued monitoring by his PCP.    Disposition: Follow up as needed   Kate Sable, M.D., F.A.C.C.

## 2019-01-19 ENCOUNTER — Ambulatory Visit (INDEPENDENT_AMBULATORY_CARE_PROVIDER_SITE_OTHER): Payer: Medicare Other | Admitting: Urology

## 2019-01-19 DIAGNOSIS — N486 Induration penis plastica: Secondary | ICD-10-CM

## 2019-02-01 DIAGNOSIS — R002 Palpitations: Secondary | ICD-10-CM | POA: Diagnosis not present

## 2019-02-01 DIAGNOSIS — J309 Allergic rhinitis, unspecified: Secondary | ICD-10-CM | POA: Diagnosis not present

## 2019-02-01 DIAGNOSIS — Z136 Encounter for screening for cardiovascular disorders: Secondary | ICD-10-CM | POA: Diagnosis not present

## 2019-02-01 DIAGNOSIS — D649 Anemia, unspecified: Secondary | ICD-10-CM | POA: Diagnosis not present

## 2019-02-01 DIAGNOSIS — N486 Induration penis plastica: Secondary | ICD-10-CM | POA: Diagnosis not present

## 2019-02-01 DIAGNOSIS — M7031 Other bursitis of elbow, right elbow: Secondary | ICD-10-CM | POA: Diagnosis not present

## 2019-02-01 DIAGNOSIS — M179 Osteoarthritis of knee, unspecified: Secondary | ICD-10-CM | POA: Diagnosis not present

## 2019-02-01 DIAGNOSIS — N400 Enlarged prostate without lower urinary tract symptoms: Secondary | ICD-10-CM | POA: Diagnosis not present

## 2019-02-01 DIAGNOSIS — R7301 Impaired fasting glucose: Secondary | ICD-10-CM | POA: Diagnosis not present

## 2019-02-01 DIAGNOSIS — M1712 Unilateral primary osteoarthritis, left knee: Secondary | ICD-10-CM | POA: Diagnosis not present

## 2019-02-01 DIAGNOSIS — E291 Testicular hypofunction: Secondary | ICD-10-CM | POA: Diagnosis not present

## 2019-02-01 DIAGNOSIS — Z6834 Body mass index (BMI) 34.0-34.9, adult: Secondary | ICD-10-CM | POA: Diagnosis not present

## 2019-02-03 DIAGNOSIS — L570 Actinic keratosis: Secondary | ICD-10-CM | POA: Diagnosis not present

## 2019-02-03 DIAGNOSIS — Z85820 Personal history of malignant melanoma of skin: Secondary | ICD-10-CM | POA: Diagnosis not present

## 2019-02-03 DIAGNOSIS — Z08 Encounter for follow-up examination after completed treatment for malignant neoplasm: Secondary | ICD-10-CM | POA: Diagnosis not present

## 2019-02-03 DIAGNOSIS — L821 Other seborrheic keratosis: Secondary | ICD-10-CM | POA: Diagnosis not present

## 2019-02-03 DIAGNOSIS — X32XXXD Exposure to sunlight, subsequent encounter: Secondary | ICD-10-CM | POA: Diagnosis not present

## 2019-03-07 DIAGNOSIS — N486 Induration penis plastica: Secondary | ICD-10-CM | POA: Diagnosis not present

## 2019-03-08 DIAGNOSIS — N486 Induration penis plastica: Secondary | ICD-10-CM | POA: Diagnosis not present

## 2019-03-10 DIAGNOSIS — N486 Induration penis plastica: Secondary | ICD-10-CM | POA: Diagnosis not present

## 2019-05-03 DIAGNOSIS — N486 Induration penis plastica: Secondary | ICD-10-CM | POA: Diagnosis not present

## 2019-06-03 DIAGNOSIS — R002 Palpitations: Secondary | ICD-10-CM | POA: Diagnosis not present

## 2019-06-03 DIAGNOSIS — M179 Osteoarthritis of knee, unspecified: Secondary | ICD-10-CM | POA: Diagnosis not present

## 2019-06-03 DIAGNOSIS — J309 Allergic rhinitis, unspecified: Secondary | ICD-10-CM | POA: Diagnosis not present

## 2019-06-03 DIAGNOSIS — Z6834 Body mass index (BMI) 34.0-34.9, adult: Secondary | ICD-10-CM | POA: Diagnosis not present

## 2019-06-03 DIAGNOSIS — M7031 Other bursitis of elbow, right elbow: Secondary | ICD-10-CM | POA: Diagnosis not present

## 2019-06-03 DIAGNOSIS — N486 Induration penis plastica: Secondary | ICD-10-CM | POA: Diagnosis not present

## 2019-06-03 DIAGNOSIS — D649 Anemia, unspecified: Secondary | ICD-10-CM | POA: Diagnosis not present

## 2019-06-03 DIAGNOSIS — Z136 Encounter for screening for cardiovascular disorders: Secondary | ICD-10-CM | POA: Diagnosis not present

## 2019-06-03 DIAGNOSIS — N400 Enlarged prostate without lower urinary tract symptoms: Secondary | ICD-10-CM | POA: Diagnosis not present

## 2019-06-03 DIAGNOSIS — R7301 Impaired fasting glucose: Secondary | ICD-10-CM | POA: Diagnosis not present

## 2019-06-03 DIAGNOSIS — M1712 Unilateral primary osteoarthritis, left knee: Secondary | ICD-10-CM | POA: Diagnosis not present

## 2019-06-03 DIAGNOSIS — E291 Testicular hypofunction: Secondary | ICD-10-CM | POA: Diagnosis not present

## 2019-06-09 ENCOUNTER — Other Ambulatory Visit (HOSPITAL_COMMUNITY): Payer: Self-pay | Admitting: Internal Medicine

## 2019-06-09 ENCOUNTER — Other Ambulatory Visit: Payer: Self-pay

## 2019-06-09 ENCOUNTER — Ambulatory Visit (HOSPITAL_COMMUNITY)
Admission: RE | Admit: 2019-06-09 | Discharge: 2019-06-09 | Disposition: A | Discharge status: Home / Self Care | Payer: Medicare Other | Source: Ambulatory Visit | Attending: Internal Medicine | Admitting: Internal Medicine

## 2019-06-09 DIAGNOSIS — R002 Palpitations: Secondary | ICD-10-CM | POA: Diagnosis not present

## 2019-06-09 DIAGNOSIS — M1611 Unilateral primary osteoarthritis, right hip: Secondary | ICD-10-CM | POA: Diagnosis not present

## 2019-06-09 DIAGNOSIS — M25551 Pain in right hip: Secondary | ICD-10-CM | POA: Insufficient documentation

## 2019-06-09 DIAGNOSIS — N486 Induration penis plastica: Secondary | ICD-10-CM | POA: Diagnosis not present

## 2019-06-09 DIAGNOSIS — E291 Testicular hypofunction: Secondary | ICD-10-CM | POA: Diagnosis not present

## 2019-06-09 DIAGNOSIS — R7301 Impaired fasting glucose: Secondary | ICD-10-CM | POA: Diagnosis not present

## 2019-06-09 DIAGNOSIS — N433 Hydrocele, unspecified: Secondary | ICD-10-CM | POA: Diagnosis not present

## 2019-06-09 DIAGNOSIS — M1712 Unilateral primary osteoarthritis, left knee: Secondary | ICD-10-CM | POA: Diagnosis not present

## 2019-06-09 DIAGNOSIS — N400 Enlarged prostate without lower urinary tract symptoms: Secondary | ICD-10-CM | POA: Diagnosis not present

## 2019-07-05 ENCOUNTER — Ambulatory Visit
Admission: EM | Admit: 2019-07-05 | Discharge: 2019-07-05 | Disposition: A | Discharge status: Home / Self Care | Payer: 59 | Attending: Emergency Medicine | Admitting: Emergency Medicine

## 2019-07-05 DIAGNOSIS — S0501XA Injury of conjunctiva and corneal abrasion without foreign body, right eye, initial encounter: Secondary | ICD-10-CM | POA: Diagnosis not present

## 2019-07-05 MED ORDER — OFLOXACIN 0.3 % OP SOLN: 1.0000 [drp] | Freq: Four times a day (QID) | OPHTHALMIC | 0 refills | Status: AC

## 2019-07-05 NOTE — ED Provider Notes (Signed)
[facility]    CSN: [id number] Arrival date & time: 07/05/19  1938      History   Chief Complaint Chief Complaint  Patient presents with  . Eye Pain    HPI Caleb Reed is a 69 y.o. male.   Patient also complains of right eye redness and irritation that began today. Reported he was bending to repair his fence and a twig went into his right eye.  Denies  personal contact with bacterial conjunctivitis.  Has tried OTC eye drops without relief.  Symptoms are made worse with right.  Denies eye pain, discharge, itching, vision changes, double vision, FB sensation.     The history is provided by the patient. No language interpreter was used.  Eye Pain    Past Medical History:  Diagnosis Date  . Arthritis   . BPH (benign prostatic hyperplasia)   . Cancer ([place])    skin ca  . Dysrhythmia   . Elevated uric acid in blood   . Gallstones   . GERD (gastroesophageal reflux disease)   . PONV (postoperative nausea and vomiting)     Patient Active Problem List   Diagnosis Date Noted  . History of colonic polyps 02/26/2018  . Personal history of colonic polyps 04/04/2013  . Gallstones 12/21/2012  . Arthritis 12/21/2012  . Diarrhea 12/21/2012    Past Surgical History:  Procedure Laterality Date  . CHOLECYSTECTOMY N/A 02/25/2013   Procedure: LAPAROSCOPIC CHOLECYSTECTOMY;  Surgeon: Jamesetta So, MD;  Location: [facility];  Service: General;  Laterality: N/A;  . COLONOSCOPY N/A 12/29/2012   Procedure: COLONOSCOPY;  Surgeon: Rogene Houston, MD;  Location: [facility];  Service: Endoscopy;  Laterality: N/A;  300-moved to 200 Ann to notify pt  . COLONOSCOPY N/A 05/19/2018   Procedure: COLONOSCOPY;  Surgeon: Rogene Houston, MD;  Location: [facility];  Service: Endoscopy;  Laterality: N/A;  05/19/2018  . EYE SURGERY     Shit with an arrow. Can see some out of his eye       Home Medications    Prior to Admission medications   Medication Sig Start Date End Date  Taking? Authorizing Provider  Glycerin-Hypromellose-PEG 400 ([facility] DRY EYE RELIEF OP) Place 1 drop into both eyes daily as needed (for dry eyes).    [provider]  ibuprofen (ADVIL) 200 MG tablet Take 200 mg by mouth every 6 (six) hours as needed.    [provider]  metroNIDAZOLE (METROCREAM) 0.75 % cream Apply 1 application topically 2 (two) times daily as needed (for red spots on face).    [provider]  Naphazoline-Pheniramine (EYE ALLERGY RELIEF OP) Place 1 drop into both eyes daily as needed (for allergies).    [provider]  ofloxacin (OCUFLOX) 0.3 % ophthalmic solution Place 1 drop into the right eye 4 (four) times daily. 07/05/19   Donia Yokum, Darrelyn Hillock, FNP  pentoxifylline (TRENTAL) 400 MG CR tablet Take 400 mg by mouth 3 (three) times daily with meals.    [provider]  testosterone cypionate (DEPOTESTOSTERONE CYPIONATE) 200 MG/ML injection Inject 100 mg into the muscle every Tuesday.  03/15/18   [provider]  traMADol (ULTRAM) 50 MG tablet Take 50 mg by mouth every 6 (six) hours as needed for moderate pain.     [provider]    Family History Family History  Problem Relation Age of Onset  . Colon cancer Neg Hx     Social History Social History  Tobacco Use  . Smoking status: Former Research scientist (life sciences)  . Smokeless tobacco: Former Systems developer    Types: Chew  . Tobacco comment: quit years ago  Substance Use Topics  . Alcohol use: Yes    Comment: occasionally wine, not often  . Drug use: No     Allergies   Etodolac and Hydrocodone   Review of Systems Review of Systems  Constitutional: Negative.   Eyes: Positive for discharge and redness. Negative for pain.  Respiratory: Negative.   Cardiovascular: Negative.   All other systems reviewed and are negative.    Physical Exam Triage Vital Signs ED Triage Vitals  Enc Vitals Group     BP      Pulse      Resp      Temp      Temp src      SpO2      Weight       Height      Head Circumference      Peak Flow      Pain Score      Pain Loc      Pain Edu?      Excl. in [place]?    No data found.  Updated Vital Signs BP 134/77   Pulse 69   Temp 98 F (36.7 C)   Resp 18   SpO2 98%   Visual Acuity Right Eye Distance:   Left Eye Distance:   Bilateral Distance:    Right Eye Near:   Left Eye Near:    Bilateral Near:     Physical Exam Vitals and nursing note reviewed.  Constitutional:      General: He is not in acute distress.    Appearance: Normal appearance. He is normal weight. He is not ill-appearing, toxic-appearing or diaphoretic.  Eyes:     General: Lids are normal. Lids are everted, no foreign bodies appreciated. Vision grossly intact. Gaze aligned appropriately. No visual field deficit.       Right eye: Discharge present. No foreign body or hordeolum.        Left eye: No foreign body, discharge or hordeolum.     Extraocular Movements: Extraocular movements intact.     Pupils: Pupils are equal, round, and reactive to light.     Comments: Right eye redness  Cardiovascular:     Rate and Rhythm: Normal rate and regular rhythm.     Pulses: Normal pulses.     Heart sounds: Normal heart sounds. No murmur. No friction rub. No gallop.   Pulmonary:     Effort: Pulmonary effort is normal. No respiratory distress.     Breath sounds: Normal breath sounds. No stridor. No wheezing, rhonchi or rales.  Chest:     Chest wall: No tenderness.  Neurological:     Mental Status: He is alert.      UC Treatments / Results  Labs (all labs ordered are listed, but only abnormal results are displayed) Labs Reviewed - No data to display  EKG   Radiology No results found.  Procedures Procedures (including critical care time)  Medications Ordered in UC Medications - No data to display  Initial Impression / Assessment and Plan / UC Course  I have reviewed the triage vital signs and the nursing notes.  Pertinent labs & imaging results that  were available during my care of the patient were reviewed by me and considered in my medical decision making (see chart for details).    Hx and PE consistent with  corneal abrasion.  Prescribe ofloxacin eye drops x7 days.  Will use OTC systane  eye drops as needed at bedtime for symptomatic relief.     Final Clinical Impressions(s) / UC Diagnoses   Final diagnoses:  Abrasion of right cornea, initial encounter     Discharge Instructions     Use ofloxacin as prescribed and to completion Use OTC systane or genteal gel eye drops at night as needed for symptomatic relief Use OTC Systane eyedrops for symptomatic relief Use OTC ibuprofen or tylenol as needed for pain relief Return here or follow up with ophthamolgy if symptoms persists or worsen     ED Prescriptions    Medication Sig Dispense Auth. Provider   ofloxacin (OCUFLOX) 0.3 % ophthalmic solution Place 1 drop into the right eye 4 (four) times daily. 5 mL Briyana Badman, Darrelyn Hillock, FNP     PDMP not reviewed this encounter.   Emerson Monte, FNP 07/05/19 2001

## 2019-07-05 NOTE — Discharge Instructions (Addendum)
Use ofloxacin as prescribed and to completion Use OTC systane or genteal gel eye drops at night as needed for symptomatic relief Use OTC Systane eyedrops for symptomatic relief Use OTC ibuprofen or tylenol as needed for pain relief Return here or follow up with ophthamolgy if symptoms persists or worsen

## 2019-07-05 NOTE — ED Triage Notes (Signed)
Pt was bending over to repair fence and twig went into right eye. Eye is red and blood shot

## 2019-07-07 DIAGNOSIS — N486 Induration penis plastica: Secondary | ICD-10-CM | POA: Diagnosis not present

## 2019-07-08 DIAGNOSIS — N486 Induration penis plastica: Secondary | ICD-10-CM | POA: Diagnosis not present

## 2019-08-18 DIAGNOSIS — N486 Induration penis plastica: Secondary | ICD-10-CM | POA: Diagnosis not present

## 2019-09-12 DIAGNOSIS — N486 Induration penis plastica: Secondary | ICD-10-CM | POA: Diagnosis not present

## 2019-09-12 DIAGNOSIS — R7301 Impaired fasting glucose: Secondary | ICD-10-CM | POA: Diagnosis not present

## 2019-09-12 DIAGNOSIS — M179 Osteoarthritis of knee, unspecified: Secondary | ICD-10-CM | POA: Diagnosis not present

## 2019-09-12 DIAGNOSIS — E291 Testicular hypofunction: Secondary | ICD-10-CM | POA: Diagnosis not present

## 2019-09-12 DIAGNOSIS — D649 Anemia, unspecified: Secondary | ICD-10-CM | POA: Diagnosis not present

## 2019-09-12 DIAGNOSIS — Z136 Encounter for screening for cardiovascular disorders: Secondary | ICD-10-CM | POA: Diagnosis not present

## 2019-09-12 DIAGNOSIS — R002 Palpitations: Secondary | ICD-10-CM | POA: Diagnosis not present

## 2019-09-12 DIAGNOSIS — M1712 Unilateral primary osteoarthritis, left knee: Secondary | ICD-10-CM | POA: Diagnosis not present

## 2019-09-12 DIAGNOSIS — Z6834 Body mass index (BMI) 34.0-34.9, adult: Secondary | ICD-10-CM | POA: Diagnosis not present

## 2019-09-12 DIAGNOSIS — N400 Enlarged prostate without lower urinary tract symptoms: Secondary | ICD-10-CM | POA: Diagnosis not present

## 2019-09-12 DIAGNOSIS — M7031 Other bursitis of elbow, right elbow: Secondary | ICD-10-CM | POA: Diagnosis not present

## 2019-09-12 DIAGNOSIS — J309 Allergic rhinitis, unspecified: Secondary | ICD-10-CM | POA: Diagnosis not present

## 2019-09-19 DIAGNOSIS — N400 Enlarged prostate without lower urinary tract symptoms: Secondary | ICD-10-CM | POA: Diagnosis not present

## 2019-09-19 DIAGNOSIS — E291 Testicular hypofunction: Secondary | ICD-10-CM | POA: Diagnosis not present

## 2019-09-19 DIAGNOSIS — M1712 Unilateral primary osteoarthritis, left knee: Secondary | ICD-10-CM | POA: Diagnosis not present

## 2019-09-19 DIAGNOSIS — D751 Secondary polycythemia: Secondary | ICD-10-CM | POA: Diagnosis not present

## 2019-09-19 DIAGNOSIS — M25551 Pain in right hip: Secondary | ICD-10-CM | POA: Diagnosis not present

## 2019-09-19 DIAGNOSIS — R030 Elevated blood-pressure reading, without diagnosis of hypertension: Secondary | ICD-10-CM | POA: Diagnosis not present

## 2019-09-19 DIAGNOSIS — R002 Palpitations: Secondary | ICD-10-CM | POA: Diagnosis not present

## 2019-09-19 DIAGNOSIS — N486 Induration penis plastica: Secondary | ICD-10-CM | POA: Diagnosis not present

## 2019-09-19 DIAGNOSIS — E785 Hyperlipidemia, unspecified: Secondary | ICD-10-CM | POA: Diagnosis not present

## 2019-09-19 DIAGNOSIS — N433 Hydrocele, unspecified: Secondary | ICD-10-CM | POA: Diagnosis not present

## 2019-09-19 DIAGNOSIS — R7301 Impaired fasting glucose: Secondary | ICD-10-CM | POA: Diagnosis not present

## 2020-10-18 DIAGNOSIS — H17822 Peripheral opacity of cornea, left eye: Secondary | ICD-10-CM | POA: Diagnosis not present

## 2020-10-18 DIAGNOSIS — H25813 Combined forms of age-related cataract, bilateral: Secondary | ICD-10-CM | POA: Diagnosis not present

## 2020-10-22 DIAGNOSIS — N400 Enlarged prostate without lower urinary tract symptoms: Secondary | ICD-10-CM | POA: Diagnosis not present

## 2020-10-22 DIAGNOSIS — R7301 Impaired fasting glucose: Secondary | ICD-10-CM | POA: Diagnosis not present

## 2020-10-22 DIAGNOSIS — M179 Osteoarthritis of knee, unspecified: Secondary | ICD-10-CM | POA: Diagnosis not present

## 2020-10-22 DIAGNOSIS — E291 Testicular hypofunction: Secondary | ICD-10-CM | POA: Diagnosis not present

## 2020-10-25 DIAGNOSIS — M179 Osteoarthritis of knee, unspecified: Secondary | ICD-10-CM | POA: Diagnosis not present

## 2020-10-25 DIAGNOSIS — N400 Enlarged prostate without lower urinary tract symptoms: Secondary | ICD-10-CM | POA: Diagnosis not present

## 2020-10-25 DIAGNOSIS — M545 Low back pain, unspecified: Secondary | ICD-10-CM | POA: Diagnosis not present

## 2020-10-25 DIAGNOSIS — D649 Anemia, unspecified: Secondary | ICD-10-CM | POA: Diagnosis not present

## 2020-10-25 DIAGNOSIS — E291 Testicular hypofunction: Secondary | ICD-10-CM | POA: Diagnosis not present

## 2020-10-25 DIAGNOSIS — L282 Other prurigo: Secondary | ICD-10-CM | POA: Diagnosis not present

## 2020-10-25 DIAGNOSIS — R7301 Impaired fasting glucose: Secondary | ICD-10-CM | POA: Diagnosis not present

## 2020-11-14 DIAGNOSIS — L570 Actinic keratosis: Secondary | ICD-10-CM | POA: Diagnosis not present

## 2020-11-14 DIAGNOSIS — Z85820 Personal history of malignant melanoma of skin: Secondary | ICD-10-CM | POA: Diagnosis not present

## 2020-11-14 DIAGNOSIS — Z1283 Encounter for screening for malignant neoplasm of skin: Secondary | ICD-10-CM | POA: Diagnosis not present

## 2020-11-14 DIAGNOSIS — L648 Other androgenic alopecia: Secondary | ICD-10-CM | POA: Diagnosis not present

## 2020-11-14 DIAGNOSIS — Z08 Encounter for follow-up examination after completed treatment for malignant neoplasm: Secondary | ICD-10-CM | POA: Diagnosis not present

## 2020-11-14 DIAGNOSIS — D225 Melanocytic nevi of trunk: Secondary | ICD-10-CM | POA: Diagnosis not present

## 2020-11-14 DIAGNOSIS — X32XXXD Exposure to sunlight, subsequent encounter: Secondary | ICD-10-CM | POA: Diagnosis not present

## 2020-11-14 DIAGNOSIS — L718 Other rosacea: Secondary | ICD-10-CM | POA: Diagnosis not present

## 2021-01-16 DIAGNOSIS — H25811 Combined forms of age-related cataract, right eye: Secondary | ICD-10-CM | POA: Diagnosis not present

## 2021-01-16 DIAGNOSIS — H17822 Peripheral opacity of cornea, left eye: Secondary | ICD-10-CM | POA: Diagnosis not present

## 2021-03-29 DIAGNOSIS — H25811 Combined forms of age-related cataract, right eye: Secondary | ICD-10-CM | POA: Diagnosis not present

## 2021-04-23 DIAGNOSIS — R7301 Impaired fasting glucose: Secondary | ICD-10-CM | POA: Diagnosis not present

## 2021-04-23 DIAGNOSIS — D649 Anemia, unspecified: Secondary | ICD-10-CM | POA: Diagnosis not present

## 2021-04-24 DIAGNOSIS — N400 Enlarged prostate without lower urinary tract symptoms: Secondary | ICD-10-CM | POA: Diagnosis not present

## 2021-04-24 DIAGNOSIS — I10 Essential (primary) hypertension: Secondary | ICD-10-CM | POA: Diagnosis not present

## 2021-04-24 DIAGNOSIS — R110 Nausea: Secondary | ICD-10-CM | POA: Diagnosis not present

## 2021-04-24 DIAGNOSIS — D649 Anemia, unspecified: Secondary | ICD-10-CM | POA: Diagnosis not present

## 2021-04-24 DIAGNOSIS — L282 Other prurigo: Secondary | ICD-10-CM | POA: Diagnosis not present

## 2021-04-24 DIAGNOSIS — E291 Testicular hypofunction: Secondary | ICD-10-CM | POA: Diagnosis not present

## 2021-04-24 DIAGNOSIS — M545 Low back pain, unspecified: Secondary | ICD-10-CM | POA: Diagnosis not present

## 2021-04-24 DIAGNOSIS — M179 Osteoarthritis of knee, unspecified: Secondary | ICD-10-CM | POA: Diagnosis not present

## 2021-04-24 DIAGNOSIS — R7301 Impaired fasting glucose: Secondary | ICD-10-CM | POA: Diagnosis not present

## 2021-05-12 ENCOUNTER — Other Ambulatory Visit: Payer: Self-pay

## 2021-05-12 ENCOUNTER — Ambulatory Visit
Admission: EM | Admit: 2021-05-12 | Discharge: 2021-05-12 | Disposition: A | Discharge status: Home / Self Care | Payer: 59 | Attending: Family Medicine | Admitting: Family Medicine

## 2021-05-12 ENCOUNTER — Encounter: Payer: Self-pay | Admitting: Emergency Medicine

## 2021-05-12 DIAGNOSIS — R5383 Other fatigue: Secondary | ICD-10-CM | POA: Diagnosis not present

## 2021-05-12 DIAGNOSIS — R509 Fever, unspecified: Secondary | ICD-10-CM

## 2021-05-12 DIAGNOSIS — R110 Nausea: Secondary | ICD-10-CM | POA: Diagnosis not present

## 2021-05-12 DIAGNOSIS — J22 Unspecified acute lower respiratory infection: Secondary | ICD-10-CM

## 2021-05-12 MED ORDER — ONDANSETRON 4 MG PO TBDP: 4.0000 mg | ORAL_TABLET | Freq: Three times a day (TID) | ORAL | 0 refills | Status: AC | PRN

## 2021-05-12 MED ORDER — DOXYCYCLINE HYCLATE 100 MG PO CAPS: 100.0000 mg | ORAL_CAPSULE | Freq: Two times a day (BID) | ORAL | 0 refills | Status: AC

## 2021-05-12 MED ORDER — PROMETHAZINE-DM 6.25-15 MG/5ML PO SYRP: 5.0000 mL | ORAL_SOLUTION | Freq: Four times a day (QID) | ORAL | 0 refills | Status: AC | PRN

## 2021-05-12 NOTE — ED Triage Notes (Signed)
Pt reports cough, fatigue, nausea x14 days. Pt denies any known fevers. Pt reports nonproductive cough or gi symptoms.

## 2021-05-16 DIAGNOSIS — J069 Acute upper respiratory infection, unspecified: Secondary | ICD-10-CM | POA: Diagnosis not present

## 2021-05-16 NOTE — ED Provider Notes (Signed)
[facility]    CSN: [id number] Arrival date & time: 05/12/21  1416      History   Chief Complaint Chief Complaint  Patient presents with   Fatigue    HPI Caleb Reed is a 71 y.o. male.   Presenting today with 14 days of progressively worsening cough, fatigue, nausea, chest tightness, shortness of breath.  Denies vomiting, diarrhea, sore throat, fever, body aches.  So far trying numerous over-the-counter cold and congestion medications, Mucinex and other supportive measures with no relief.  No known history of chronic pulmonary disease.  Wife sick with similar symptoms.   Past Medical History:  Diagnosis Date   Arthritis    BPH (benign prostatic hyperplasia)    Cancer ([facility])    skin ca   Dysrhythmia    Elevated uric acid in blood    Gallstones    GERD (gastroesophageal reflux disease)    PONV (postoperative nausea and vomiting)     Patient Active Problem List   Diagnosis Date Noted   History of colonic polyps 02/26/2018   Personal history of colonic polyps 04/04/2013   Gallstones 12/21/2012   Arthritis 12/21/2012   Diarrhea 12/21/2012    Past Surgical History:  Procedure Laterality Date   CHOLECYSTECTOMY N/A 02/25/2013   Procedure: LAPAROSCOPIC CHOLECYSTECTOMY;  Surgeon: Jamesetta So, MD;  Location: [facility];  Service: General;  Laterality: N/A;   COLONOSCOPY N/A 12/29/2012   Procedure: COLONOSCOPY;  Surgeon: Rogene Houston, MD;  Location[facility];  Service: Endoscopy;  Laterality: N/A;  300-moved to 200 Ann to notify pt   COLONOSCOPY N/A 05/19/2018   Procedure: COLONOSCOPY;  Surgeon: Rogene Houston, MD;  Location: [facility];  Service: Endoscopy;  Laterality: N/A;  05/19/2018   EYE SURGERY     Shit with an arrow. Can see some out of his eye       Home Medications    Prior to Admission medications   Medication Sig Start Date End Date Taking? Authorizing Provider  doxycycline (VIBRAMYCIN) 100 MG capsule Take 1 capsule (100 mg  total) by mouth 2 (two) times daily. 05/12/21  Yes Volney American, PA-C  Glycerin-Hypromellose-PEG 400 ([facility] DRY EYE RELIEF OP) Place 1 drop into both eyes daily as needed (for dry eyes).   Yes [provider]  ibuprofen (ADVIL) 200 MG tablet Take 200 mg by mouth every 6 (six) hours as needed.   Yes [provider]  metroNIDAZOLE (METROCREAM) 0.75 % cream Apply 1 application topically 2 (two) times daily as needed (for red spots on face).   Yes [provider]  Naphazoline-Pheniramine (EYE ALLERGY RELIEF OP) Place 1 drop into both eyes daily as needed (for allergies).   Yes [provider]  ofloxacin (OCUFLOX) 0.3 % ophthalmic solution Place 1 drop into the right eye 4 (four) times daily. 07/05/19  Yes Avegno, Darrelyn Hillock, FNP  ondansetron (ZOFRAN-ODT) 4 MG disintegrating tablet Take 1 tablet (4 mg total) by mouth every 8 (eight) hours as needed for nausea or vomiting. 05/12/21  Yes Volney American, PA-C  promethazine-dextromethorphan (PROMETHAZINE-DM) 6.25-15 MG/5ML syrup Take 5 mLs by mouth 4 (four) times daily as needed. 05/12/21  Yes Volney American, PA-C  pentoxifylline (TRENTAL) 400 MG CR tablet Take 400 mg by mouth 3 (three) times daily with meals.    [provider]  testosterone cypionate (DEPOTESTOSTERONE CYPIONATE) 200 MG/ML injection Inject 100 mg into the muscle every Tuesday.  03/15/18   [provider]  traMADol (ULTRAM) 50 MG tablet Take 50 mg by mouth every 6 (six) hours as needed for moderate pain.     [provider]    Family History Family History  Problem Relation Age of Onset   Colon cancer Neg Hx     Social History Social History   Tobacco Use   Smoking status: Former   Smokeless tobacco: Former    Types: Chew   Tobacco comments:    quit years ago  Vaping Use   Vaping Use: Never used  Substance Use Topics   Alcohol use: Yes    Comment: occasionally wine, not often   Drug use: No      Allergies   Etodolac and Hydrocodone   Review of Systems Review of Systems Per HPI  Physical Exam Triage Vital Signs ED Triage Vitals  Enc Vitals Group     BP 05/12/21 1519 129/76     Pulse Rate 05/12/21 1519 78     Resp 05/12/21 1519 18     Temp 05/12/21 1519 100 F (37.8 C)     Temp Source 05/12/21 1519 Oral     SpO2 05/12/21 1519 90 %     Weight 05/12/21 1520 245 lb (111.1 kg)     Height 05/12/21 1520 6' (1.829 m)     Head Circumference --      Peak Flow --      Pain Score 05/12/21 1520 0     Pain Loc --      Pain Edu? --      Excl. in [place]? --    No data found.  Updated Vital Signs BP 129/76 (BP Location: Right Arm)    Pulse 78    Temp 100 F (37.8 C) (Oral)    Resp 18    Ht 6' (1.829 m)    Wt 245 lb (111.1 kg)    SpO2 90%    BMI 33.23 kg/m   Visual Acuity Right Eye Distance:   Left Eye Distance:   Bilateral Distance:    Right Eye Near:   Left Eye Near:    Bilateral Near:     Physical Exam Vitals and nursing note reviewed.  Constitutional:      Appearance: He is well-developed.  HENT:     Head: Atraumatic.     Right Ear: External ear normal.     Left Ear: External ear normal.     Nose: Congestion present.     Mouth/Throat:     Mouth: Mucous membranes are moist.     Pharynx: Posterior oropharyngeal erythema present. No oropharyngeal exudate.  Eyes:     Conjunctiva/sclera: Conjunctivae normal.     Pupils: Pupils are equal, round, and reactive to light.  Cardiovascular:     Rate and Rhythm: Normal rate and regular rhythm.     Heart sounds: Normal heart sounds.  Pulmonary:     Effort: Pulmonary effort is normal. No respiratory distress.     Breath sounds: Wheezing present. No rales.  Musculoskeletal:        General: Normal range of motion.     Cervical back: Normal range of motion and neck supple.  Lymphadenopathy:     Cervical: No cervical adenopathy.  Skin:    General: Skin is warm and dry.  Neurological:     Mental Status: He is alert  and oriented to person, place, and time.  Psychiatric:        Behavior: Behavior normal.   UC Treatments / Results  Labs (all labs ordered are listed, but only abnormal results are displayed) Labs Reviewed - No data to display  EKG   Radiology No results found.  Procedures Procedures (including critical care time)  Medications Ordered in UC Medications - No data to display  Initial Impression / Assessment and Plan / UC Course  I have reviewed the triage vital signs and the nursing notes.  Pertinent labs & imaging results that were available during my care of the patient were reviewed by me and considered in my medical decision making (see chart for details).     Oxygen saturation 90% on room air which is not his baseline and he does not have any diagnosed pulmonary disease and he also has a low-grade fever at 100 F.  Given these findings as well as the progressively worsening symptoms over the course of 2 weeks, will cover for a lower respiratory infection with doxycycline, Phenergan DM, Zofran for nausea as needed.  Discussed return precautions, supportive home care.  Final Clinical Impressions(s) / UC Diagnoses   Final diagnoses:  Lower respiratory infection  Fever, unspecified  Nausea without vomiting  Fatigue, unspecified type   Discharge Instructions   None    ED Prescriptions     Medication Sig Dispense Auth. Provider   doxycycline (VIBRAMYCIN) 100 MG capsule Take 1 capsule (100 mg total) by mouth 2 (two) times daily. 20 capsule Volney American, [place]   promethazine-dextromethorphan (PROMETHAZINE-DM) 6.25-15 MG/5ML syrup Take 5 mLs by mouth 4 (four) times daily as needed. 100 mL Volney American, PA-C   ondansetron (ZOFRAN-ODT) 4 MG disintegrating tablet Take 1 tablet (4 mg total) by mouth every 8 (eight) hours as needed for nausea or vomiting. 20 tablet Volney American, [place]      PDMP not reviewed this encounter.   Volney American, [place] 05/16/21 1449

## 2021-05-30 DIAGNOSIS — J069 Acute upper respiratory infection, unspecified: Secondary | ICD-10-CM | POA: Diagnosis not present

## 2021-08-29 ENCOUNTER — Ambulatory Visit (HOSPITAL_COMMUNITY)
Admission: RE | Admit: 2021-08-29 | Discharge: 2021-08-29 | Disposition: A | Discharge status: Home / Self Care | Payer: Medicare Other | Source: Ambulatory Visit | Attending: Family Medicine | Admitting: Family Medicine

## 2021-08-29 ENCOUNTER — Other Ambulatory Visit (HOSPITAL_COMMUNITY): Payer: Self-pay | Admitting: Family Medicine

## 2021-08-29 DIAGNOSIS — R0600 Dyspnea, unspecified: Secondary | ICD-10-CM | POA: Insufficient documentation

## 2021-08-29 DIAGNOSIS — R0683 Snoring: Secondary | ICD-10-CM | POA: Diagnosis not present

## 2021-08-29 DIAGNOSIS — R079 Chest pain, unspecified: Secondary | ICD-10-CM | POA: Diagnosis not present

## 2021-08-29 DIAGNOSIS — L282 Other prurigo: Secondary | ICD-10-CM | POA: Diagnosis not present

## 2021-10-04 DIAGNOSIS — M170 Bilateral primary osteoarthritis of knee: Secondary | ICD-10-CM | POA: Diagnosis not present

## 2021-10-18 DIAGNOSIS — N400 Enlarged prostate without lower urinary tract symptoms: Secondary | ICD-10-CM | POA: Diagnosis not present

## 2021-10-18 DIAGNOSIS — E291 Testicular hypofunction: Secondary | ICD-10-CM | POA: Diagnosis not present

## 2021-10-18 DIAGNOSIS — D649 Anemia, unspecified: Secondary | ICD-10-CM | POA: Diagnosis not present

## 2021-10-18 DIAGNOSIS — R7301 Impaired fasting glucose: Secondary | ICD-10-CM | POA: Diagnosis not present

## 2021-10-18 DIAGNOSIS — I10 Essential (primary) hypertension: Secondary | ICD-10-CM | POA: Diagnosis not present

## 2021-10-18 DIAGNOSIS — E559 Vitamin D deficiency, unspecified: Secondary | ICD-10-CM | POA: Diagnosis not present

## 2021-10-21 DIAGNOSIS — H25812 Combined forms of age-related cataract, left eye: Secondary | ICD-10-CM | POA: Diagnosis not present

## 2021-10-21 DIAGNOSIS — Z961 Presence of intraocular lens: Secondary | ICD-10-CM | POA: Diagnosis not present

## 2021-10-21 DIAGNOSIS — H17822 Peripheral opacity of cornea, left eye: Secondary | ICD-10-CM | POA: Diagnosis not present

## 2021-10-22 DIAGNOSIS — E662 Morbid (severe) obesity with alveolar hypoventilation: Secondary | ICD-10-CM | POA: Diagnosis not present

## 2021-10-22 DIAGNOSIS — D106 Benign neoplasm of nasopharynx: Secondary | ICD-10-CM | POA: Diagnosis not present

## 2021-10-22 DIAGNOSIS — E291 Testicular hypofunction: Secondary | ICD-10-CM | POA: Diagnosis not present

## 2021-10-22 DIAGNOSIS — E7800 Pure hypercholesterolemia, unspecified: Secondary | ICD-10-CM | POA: Diagnosis not present

## 2021-10-22 DIAGNOSIS — R7301 Impaired fasting glucose: Secondary | ICD-10-CM | POA: Diagnosis not present

## 2021-10-22 DIAGNOSIS — D649 Anemia, unspecified: Secondary | ICD-10-CM | POA: Diagnosis not present

## 2021-10-22 DIAGNOSIS — M179 Osteoarthritis of knee, unspecified: Secondary | ICD-10-CM | POA: Diagnosis not present

## 2021-10-22 DIAGNOSIS — M545 Low back pain, unspecified: Secondary | ICD-10-CM | POA: Diagnosis not present

## 2021-10-22 DIAGNOSIS — I10 Essential (primary) hypertension: Secondary | ICD-10-CM | POA: Diagnosis not present

## 2021-10-22 DIAGNOSIS — N400 Enlarged prostate without lower urinary tract symptoms: Secondary | ICD-10-CM | POA: Diagnosis not present

## 2021-10-22 DIAGNOSIS — Z6836 Body mass index (BMI) 36.0-36.9, adult: Secondary | ICD-10-CM | POA: Diagnosis not present

## 2021-10-31 ENCOUNTER — Other Ambulatory Visit (HOSPITAL_COMMUNITY): Payer: Self-pay | Admitting: Orthopedic Surgery

## 2021-10-31 ENCOUNTER — Other Ambulatory Visit: Payer: Self-pay | Admitting: Orthopedic Surgery

## 2021-10-31 DIAGNOSIS — M25561 Pain in right knee: Secondary | ICD-10-CM

## 2021-11-01 ENCOUNTER — Ambulatory Visit
Admission: RE | Admit: 2021-11-01 | Discharge: 2021-11-01 | Disposition: A | Discharge status: Home / Self Care | Payer: Medicare Other | Source: Ambulatory Visit | Attending: Orthopedic Surgery | Admitting: Orthopedic Surgery

## 2021-11-01 DIAGNOSIS — M25561 Pain in right knee: Secondary | ICD-10-CM

## 2021-11-11 DIAGNOSIS — M170 Bilateral primary osteoarthritis of knee: Secondary | ICD-10-CM | POA: Diagnosis not present

## 2021-11-12 ENCOUNTER — Ambulatory Visit (HOSPITAL_COMMUNITY): Payer: Medicare Other

## 2021-11-12 ENCOUNTER — Encounter (HOSPITAL_COMMUNITY): Payer: Self-pay

## 2022-04-18 DIAGNOSIS — I10 Essential (primary) hypertension: Secondary | ICD-10-CM | POA: Diagnosis not present

## 2022-04-18 DIAGNOSIS — R7301 Impaired fasting glucose: Secondary | ICD-10-CM | POA: Diagnosis not present

## 2022-04-18 DIAGNOSIS — E291 Testicular hypofunction: Secondary | ICD-10-CM | POA: Diagnosis not present

## 2022-04-25 DIAGNOSIS — E291 Testicular hypofunction: Secondary | ICD-10-CM | POA: Diagnosis not present

## 2022-04-25 DIAGNOSIS — E7800 Pure hypercholesterolemia, unspecified: Secondary | ICD-10-CM | POA: Diagnosis not present

## 2022-04-25 DIAGNOSIS — D649 Anemia, unspecified: Secondary | ICD-10-CM | POA: Diagnosis not present

## 2022-04-25 DIAGNOSIS — Z87891 Personal history of nicotine dependence: Secondary | ICD-10-CM | POA: Diagnosis not present

## 2022-04-25 DIAGNOSIS — Z0000 Encounter for general adult medical examination without abnormal findings: Secondary | ICD-10-CM | POA: Diagnosis not present

## 2022-04-25 DIAGNOSIS — I10 Essential (primary) hypertension: Secondary | ICD-10-CM | POA: Diagnosis not present

## 2022-04-25 DIAGNOSIS — R7301 Impaired fasting glucose: Secondary | ICD-10-CM | POA: Diagnosis not present

## 2022-04-25 DIAGNOSIS — Z0001 Encounter for general adult medical examination with abnormal findings: Secondary | ICD-10-CM | POA: Diagnosis not present

## 2022-04-25 DIAGNOSIS — N400 Enlarged prostate without lower urinary tract symptoms: Secondary | ICD-10-CM | POA: Diagnosis not present

## 2022-04-25 DIAGNOSIS — M179 Osteoarthritis of knee, unspecified: Secondary | ICD-10-CM | POA: Diagnosis not present

## 2022-04-25 DIAGNOSIS — M545 Low back pain, unspecified: Secondary | ICD-10-CM | POA: Diagnosis not present

## 2022-04-25 DIAGNOSIS — G8929 Other chronic pain: Secondary | ICD-10-CM | POA: Diagnosis not present

## 2022-09-09 DIAGNOSIS — Z87891 Personal history of nicotine dependence: Secondary | ICD-10-CM | POA: Diagnosis not present

## 2022-09-09 DIAGNOSIS — Z713 Dietary counseling and surveillance: Secondary | ICD-10-CM | POA: Diagnosis not present

## 2022-09-09 DIAGNOSIS — Z6835 Body mass index (BMI) 35.0-35.9, adult: Secondary | ICD-10-CM | POA: Diagnosis not present

## 2022-09-09 DIAGNOSIS — J302 Other seasonal allergic rhinitis: Secondary | ICD-10-CM | POA: Diagnosis not present

## 2022-09-09 DIAGNOSIS — R42 Dizziness and giddiness: Secondary | ICD-10-CM | POA: Diagnosis not present

## 2022-10-23 DIAGNOSIS — N400 Enlarged prostate without lower urinary tract symptoms: Secondary | ICD-10-CM | POA: Diagnosis not present

## 2022-10-23 DIAGNOSIS — E291 Testicular hypofunction: Secondary | ICD-10-CM | POA: Diagnosis not present

## 2022-10-23 DIAGNOSIS — R7301 Impaired fasting glucose: Secondary | ICD-10-CM | POA: Diagnosis not present

## 2022-10-23 DIAGNOSIS — I10 Essential (primary) hypertension: Secondary | ICD-10-CM | POA: Diagnosis not present

## 2022-10-27 DIAGNOSIS — H35011 Changes in retinal vascular appearance, right eye: Secondary | ICD-10-CM | POA: Diagnosis not present

## 2022-10-29 DIAGNOSIS — Z6835 Body mass index (BMI) 35.0-35.9, adult: Secondary | ICD-10-CM | POA: Diagnosis not present

## 2022-10-29 DIAGNOSIS — N486 Induration penis plastica: Secondary | ICD-10-CM | POA: Diagnosis not present

## 2022-10-29 DIAGNOSIS — N400 Enlarged prostate without lower urinary tract symptoms: Secondary | ICD-10-CM | POA: Diagnosis not present

## 2022-10-29 DIAGNOSIS — I10 Essential (primary) hypertension: Secondary | ICD-10-CM | POA: Diagnosis not present

## 2022-10-29 DIAGNOSIS — D649 Anemia, unspecified: Secondary | ICD-10-CM | POA: Diagnosis not present

## 2022-10-29 DIAGNOSIS — E7800 Pure hypercholesterolemia, unspecified: Secondary | ICD-10-CM | POA: Diagnosis not present

## 2022-10-29 DIAGNOSIS — Z713 Dietary counseling and surveillance: Secondary | ICD-10-CM | POA: Diagnosis not present

## 2022-10-29 DIAGNOSIS — E291 Testicular hypofunction: Secondary | ICD-10-CM | POA: Diagnosis not present

## 2022-10-29 DIAGNOSIS — M545 Low back pain, unspecified: Secondary | ICD-10-CM | POA: Diagnosis not present

## 2022-10-29 DIAGNOSIS — R7301 Impaired fasting glucose: Secondary | ICD-10-CM | POA: Diagnosis not present

## 2022-10-29 DIAGNOSIS — M179 Osteoarthritis of knee, unspecified: Secondary | ICD-10-CM | POA: Diagnosis not present

## 2022-12-08 DIAGNOSIS — H3509 Other intraretinal microvascular abnormalities: Secondary | ICD-10-CM | POA: Diagnosis not present

## 2022-12-08 DIAGNOSIS — H21512 Anterior synechiae (iris), left eye: Secondary | ICD-10-CM | POA: Diagnosis not present

## 2022-12-08 DIAGNOSIS — H04123 Dry eye syndrome of bilateral lacrimal glands: Secondary | ICD-10-CM | POA: Diagnosis not present

## 2022-12-08 DIAGNOSIS — H3581 Retinal edema: Secondary | ICD-10-CM | POA: Diagnosis not present

## 2022-12-16 DIAGNOSIS — H3581 Retinal edema: Secondary | ICD-10-CM | POA: Diagnosis not present

## 2022-12-16 DIAGNOSIS — H3509 Other intraretinal microvascular abnormalities: Secondary | ICD-10-CM | POA: Diagnosis not present

## 2022-12-16 DIAGNOSIS — H348311 Tributary (branch) retinal vein occlusion, right eye, with retinal neovascularization: Secondary | ICD-10-CM | POA: Diagnosis not present

## 2022-12-16 DIAGNOSIS — H35041 Retinal micro-aneurysms, unspecified, right eye: Secondary | ICD-10-CM | POA: Diagnosis not present

## 2023-01-15 DIAGNOSIS — H3509 Other intraretinal microvascular abnormalities: Secondary | ICD-10-CM | POA: Diagnosis not present

## 2023-01-15 DIAGNOSIS — H259 Unspecified age-related cataract: Secondary | ICD-10-CM | POA: Diagnosis not present

## 2023-01-15 DIAGNOSIS — H348311 Tributary (branch) retinal vein occlusion, right eye, with retinal neovascularization: Secondary | ICD-10-CM | POA: Diagnosis not present

## 2023-01-15 DIAGNOSIS — H35041 Retinal micro-aneurysms, unspecified, right eye: Secondary | ICD-10-CM | POA: Diagnosis not present

## 2023-01-15 DIAGNOSIS — H21512 Anterior synechiae (iris), left eye: Secondary | ICD-10-CM | POA: Diagnosis not present

## 2023-01-15 DIAGNOSIS — H04123 Dry eye syndrome of bilateral lacrimal glands: Secondary | ICD-10-CM | POA: Diagnosis not present

## 2023-01-15 DIAGNOSIS — H3581 Retinal edema: Secondary | ICD-10-CM | POA: Diagnosis not present

## 2023-02-02 DIAGNOSIS — M7022 Olecranon bursitis, left elbow: Secondary | ICD-10-CM | POA: Diagnosis not present

## 2023-02-18 ENCOUNTER — Telehealth: Payer: Self-pay | Admitting: Physical Medicine and Rehabilitation

## 2023-02-18 NOTE — Telephone Encounter (Signed)
Patient called and wants an appointment to him because she barely can walk. CB#[phone]

## 2023-02-18 NOTE — Telephone Encounter (Signed)
Message sent in error

## 2023-04-15 ENCOUNTER — Encounter (INDEPENDENT_AMBULATORY_CARE_PROVIDER_SITE_OTHER): Payer: Self-pay | Admitting: *Deleted

## 2023-04-30 DIAGNOSIS — I10 Essential (primary) hypertension: Secondary | ICD-10-CM | POA: Diagnosis not present

## 2023-04-30 DIAGNOSIS — R7301 Impaired fasting glucose: Secondary | ICD-10-CM | POA: Diagnosis not present

## 2023-04-30 DIAGNOSIS — E291 Testicular hypofunction: Secondary | ICD-10-CM | POA: Diagnosis not present

## 2023-05-07 DIAGNOSIS — N486 Induration penis plastica: Secondary | ICD-10-CM | POA: Diagnosis not present

## 2023-05-07 DIAGNOSIS — M170 Bilateral primary osteoarthritis of knee: Secondary | ICD-10-CM | POA: Diagnosis not present

## 2023-05-07 DIAGNOSIS — I10 Essential (primary) hypertension: Secondary | ICD-10-CM | POA: Diagnosis not present

## 2023-05-07 DIAGNOSIS — M545 Low back pain, unspecified: Secondary | ICD-10-CM | POA: Diagnosis not present

## 2023-05-07 DIAGNOSIS — E7800 Pure hypercholesterolemia, unspecified: Secondary | ICD-10-CM | POA: Diagnosis not present

## 2023-05-07 DIAGNOSIS — E291 Testicular hypofunction: Secondary | ICD-10-CM | POA: Diagnosis not present

## 2023-05-07 DIAGNOSIS — R7301 Impaired fasting glucose: Secondary | ICD-10-CM | POA: Diagnosis not present

## 2023-05-07 DIAGNOSIS — N400 Enlarged prostate without lower urinary tract symptoms: Secondary | ICD-10-CM | POA: Diagnosis not present

## 2023-05-07 DIAGNOSIS — L219 Seborrheic dermatitis, unspecified: Secondary | ICD-10-CM | POA: Diagnosis not present

## 2023-05-07 DIAGNOSIS — G8929 Other chronic pain: Secondary | ICD-10-CM | POA: Diagnosis not present

## 2023-05-07 DIAGNOSIS — D649 Anemia, unspecified: Secondary | ICD-10-CM | POA: Diagnosis not present

## 2023-06-01 DIAGNOSIS — H35041 Retinal micro-aneurysms, unspecified, right eye: Secondary | ICD-10-CM | POA: Diagnosis not present

## 2023-06-01 DIAGNOSIS — H348311 Tributary (branch) retinal vein occlusion, right eye, with retinal neovascularization: Secondary | ICD-10-CM | POA: Diagnosis not present

## 2023-06-01 DIAGNOSIS — H04123 Dry eye syndrome of bilateral lacrimal glands: Secondary | ICD-10-CM | POA: Diagnosis not present

## 2023-06-01 DIAGNOSIS — H3581 Retinal edema: Secondary | ICD-10-CM | POA: Diagnosis not present

## 2023-06-01 DIAGNOSIS — H21512 Anterior synechiae (iris), left eye: Secondary | ICD-10-CM | POA: Diagnosis not present

## 2023-06-01 DIAGNOSIS — H3509 Other intraretinal microvascular abnormalities: Secondary | ICD-10-CM | POA: Diagnosis not present

## 2023-09-08 IMAGING — DX DG CHEST 2V
2 series · 2 of 2 positions shown · non-contrast
Comparison: May 16, 2014

CLINICAL DATA: Dyspnea.  Chest pain.

EXAM:
CHEST - 2 VIEW

[chest pa]
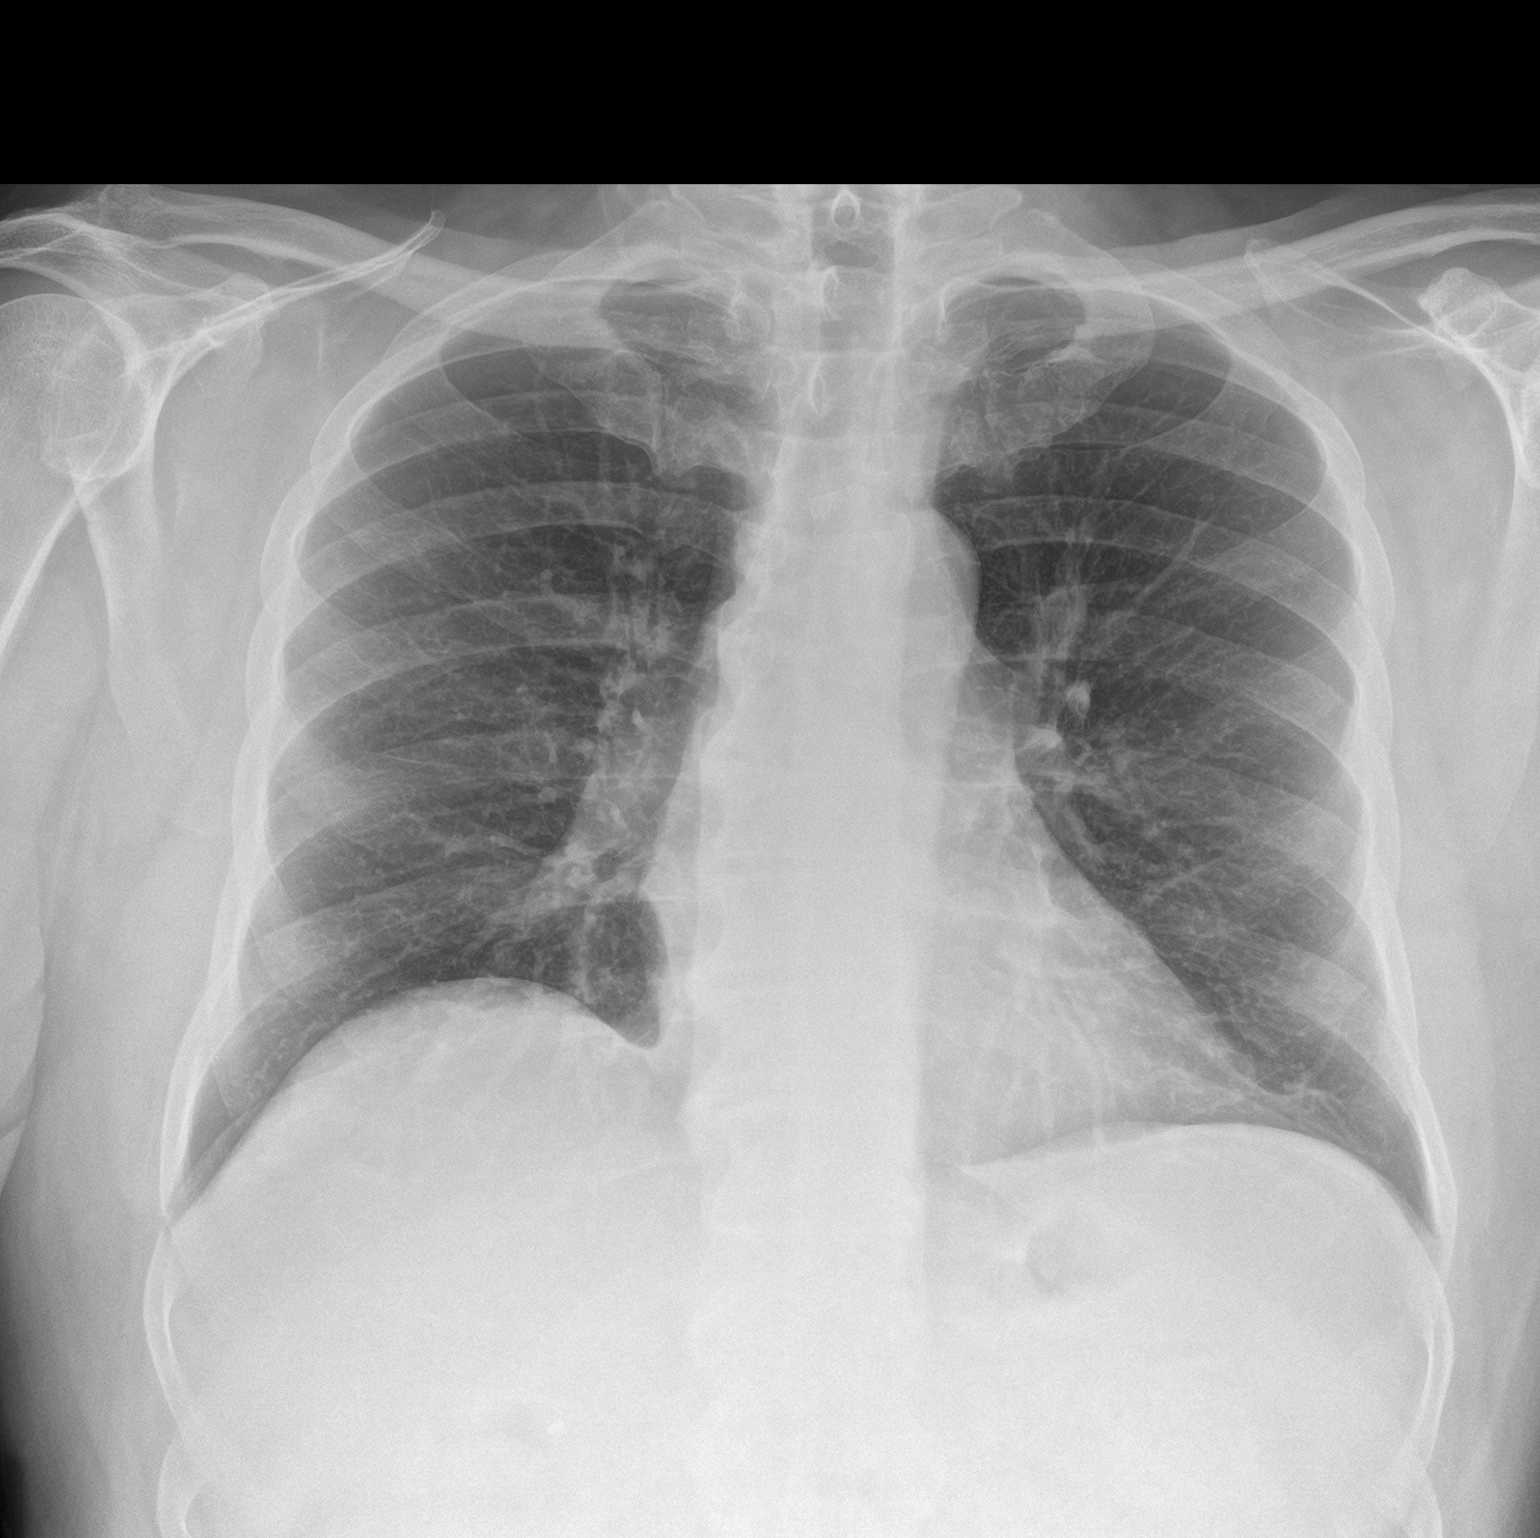

[chest lat]
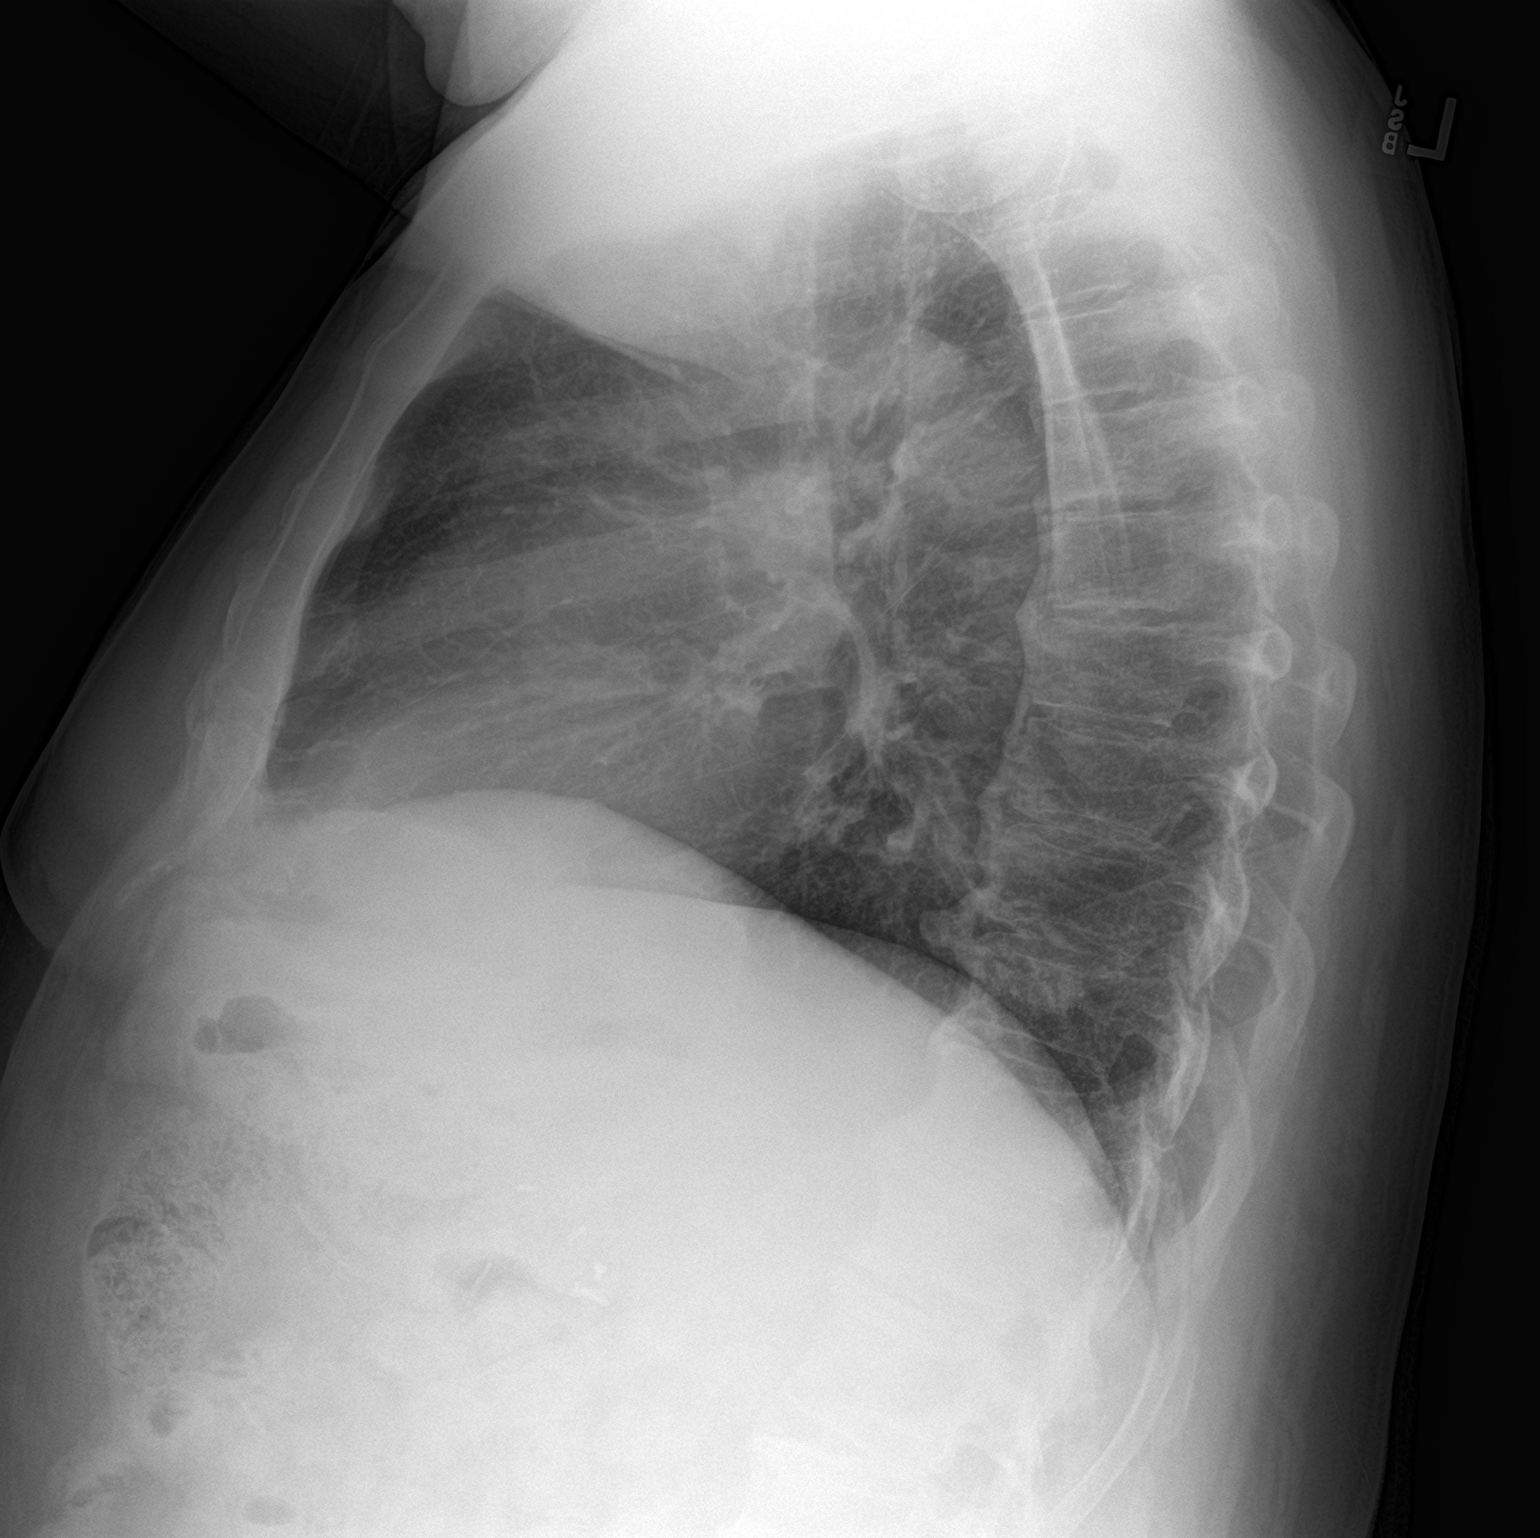

[2 of 2 positions shown; findings below may reference images not displayed]

FINDINGS: The heart size and mediastinal contours are within normal limits.
Both lungs are clear. The visualized skeletal structures are
unremarkable.
IMPRESSION: No active cardiopulmonary disease.

## 2023-10-28 DIAGNOSIS — R7301 Impaired fasting glucose: Secondary | ICD-10-CM | POA: Diagnosis not present

## 2023-10-28 DIAGNOSIS — Z125 Encounter for screening for malignant neoplasm of prostate: Secondary | ICD-10-CM | POA: Diagnosis not present

## 2023-10-28 DIAGNOSIS — I10 Essential (primary) hypertension: Secondary | ICD-10-CM | POA: Diagnosis not present

## 2023-11-04 DIAGNOSIS — E7800 Pure hypercholesterolemia, unspecified: Secondary | ICD-10-CM | POA: Diagnosis not present

## 2023-11-04 DIAGNOSIS — E291 Testicular hypofunction: Secondary | ICD-10-CM | POA: Diagnosis not present

## 2023-11-04 DIAGNOSIS — M170 Bilateral primary osteoarthritis of knee: Secondary | ICD-10-CM | POA: Diagnosis not present

## 2023-11-04 DIAGNOSIS — N400 Enlarged prostate without lower urinary tract symptoms: Secondary | ICD-10-CM | POA: Diagnosis not present

## 2023-11-04 DIAGNOSIS — N486 Induration penis plastica: Secondary | ICD-10-CM | POA: Diagnosis not present

## 2023-11-04 DIAGNOSIS — I10 Essential (primary) hypertension: Secondary | ICD-10-CM | POA: Diagnosis not present

## 2023-11-04 DIAGNOSIS — D649 Anemia, unspecified: Secondary | ICD-10-CM | POA: Diagnosis not present

## 2023-11-04 DIAGNOSIS — R7301 Impaired fasting glucose: Secondary | ICD-10-CM | POA: Diagnosis not present

## 2023-11-04 DIAGNOSIS — M545 Low back pain, unspecified: Secondary | ICD-10-CM | POA: Diagnosis not present

## 2023-11-09 DIAGNOSIS — Z961 Presence of intraocular lens: Secondary | ICD-10-CM | POA: Diagnosis not present

## 2023-11-09 DIAGNOSIS — H17822 Peripheral opacity of cornea, left eye: Secondary | ICD-10-CM | POA: Diagnosis not present

## 2023-11-09 DIAGNOSIS — H35011 Changes in retinal vascular appearance, right eye: Secondary | ICD-10-CM | POA: Diagnosis not present

## 2023-11-09 DIAGNOSIS — H25812 Combined forms of age-related cataract, left eye: Secondary | ICD-10-CM | POA: Diagnosis not present

## 2024-01-18 DIAGNOSIS — Z08 Encounter for follow-up examination after completed treatment for malignant neoplasm: Secondary | ICD-10-CM | POA: Diagnosis not present

## 2024-01-18 DIAGNOSIS — Z85820 Personal history of malignant melanoma of skin: Secondary | ICD-10-CM | POA: Diagnosis not present

## 2024-01-18 DIAGNOSIS — D225 Melanocytic nevi of trunk: Secondary | ICD-10-CM | POA: Diagnosis not present

## 2024-01-18 DIAGNOSIS — L718 Other rosacea: Secondary | ICD-10-CM | POA: Diagnosis not present

## 2024-01-18 DIAGNOSIS — L720 Epidermal cyst: Secondary | ICD-10-CM | POA: Diagnosis not present

## 2024-01-18 DIAGNOSIS — Z1283 Encounter for screening for malignant neoplasm of skin: Secondary | ICD-10-CM | POA: Diagnosis not present

## 2024-02-01 DIAGNOSIS — H04123 Dry eye syndrome of bilateral lacrimal glands: Secondary | ICD-10-CM | POA: Diagnosis not present

## 2024-02-01 DIAGNOSIS — H348311 Tributary (branch) retinal vein occlusion, right eye, with retinal neovascularization: Secondary | ICD-10-CM | POA: Diagnosis not present

## 2024-02-01 DIAGNOSIS — H35041 Retinal micro-aneurysms, unspecified, right eye: Secondary | ICD-10-CM | POA: Diagnosis not present

## 2024-02-01 DIAGNOSIS — H3581 Retinal edema: Secondary | ICD-10-CM | POA: Diagnosis not present

## 2024-02-01 DIAGNOSIS — H259 Unspecified age-related cataract: Secondary | ICD-10-CM | POA: Diagnosis not present

## 2024-02-01 DIAGNOSIS — H21512 Anterior synechiae (iris), left eye: Secondary | ICD-10-CM | POA: Diagnosis not present

## 2024-02-01 DIAGNOSIS — H3509 Other intraretinal microvascular abnormalities: Secondary | ICD-10-CM | POA: Diagnosis not present

## 2024-03-07 DIAGNOSIS — Z85820 Personal history of malignant melanoma of skin: Secondary | ICD-10-CM | POA: Diagnosis not present

## 2024-03-07 DIAGNOSIS — L720 Epidermal cyst: Secondary | ICD-10-CM | POA: Diagnosis not present

## 2024-03-07 DIAGNOSIS — X32XXXD Exposure to sunlight, subsequent encounter: Secondary | ICD-10-CM | POA: Diagnosis not present

## 2024-03-07 DIAGNOSIS — Z08 Encounter for follow-up examination after completed treatment for malignant neoplasm: Secondary | ICD-10-CM | POA: Diagnosis not present

## 2024-03-07 DIAGNOSIS — L570 Actinic keratosis: Secondary | ICD-10-CM | POA: Diagnosis not present
